# Patient Record
Sex: Female | Born: 1960 | Race: White | Hispanic: No | Marital: Single | Smoking: Current every day smoker
Health system: Southern US, Community
[De-identification: ages and names within clinical notes are randomized; demographics above are authoritative.]

## PROBLEM LIST (undated history)

## (undated) DIAGNOSIS — M1990 Unspecified osteoarthritis, unspecified site: Secondary | ICD-10-CM

## (undated) DIAGNOSIS — I10 Essential (primary) hypertension: Secondary | ICD-10-CM

## (undated) HISTORY — PX: SHOULDER CAPSULORRHAPHY: SUR188

## (undated) HISTORY — PX: WRIST FLEXION TENDON TENOTOMIES AND PROXIMAL CORPECTOMY W/ WRIST ARTHRODESIS&ILIAC CREST BONE GRAFT: SHX2672

---

## 2003-10-24 ENCOUNTER — Ambulatory Visit (HOSPITAL_BASED_OUTPATIENT_CLINIC_OR_DEPARTMENT_OTHER): Admission: RE | Admit: 2003-10-24 | Discharge: 2003-10-24 | Payer: Self-pay | Admitting: Orthopedic Surgery

## 2004-09-17 ENCOUNTER — Ambulatory Visit (HOSPITAL_BASED_OUTPATIENT_CLINIC_OR_DEPARTMENT_OTHER): Admission: RE | Admit: 2004-09-17 | Discharge: 2004-09-17 | Payer: Self-pay | Admitting: Orthopedic Surgery

## 2004-09-17 ENCOUNTER — Ambulatory Visit (HOSPITAL_COMMUNITY): Admission: RE | Admit: 2004-09-17 | Discharge: 2004-09-17 | Payer: Self-pay | Admitting: Orthopedic Surgery

## 2004-12-30 ENCOUNTER — Ambulatory Visit (HOSPITAL_BASED_OUTPATIENT_CLINIC_OR_DEPARTMENT_OTHER): Admission: RE | Admit: 2004-12-30 | Discharge: 2004-12-30 | Payer: Self-pay | Admitting: Orthopedic Surgery

## 2006-10-19 ENCOUNTER — Ambulatory Visit (HOSPITAL_COMMUNITY): Admission: RE | Admit: 2006-10-19 | Discharge: 2006-10-19 | Payer: Self-pay | Admitting: Oncology

## 2006-11-21 ENCOUNTER — Other Ambulatory Visit: Admission: RE | Admit: 2006-11-21 | Discharge: 2006-11-21 | Payer: Self-pay | Admitting: Family Medicine

## 2009-11-26 ENCOUNTER — Inpatient Hospital Stay (HOSPITAL_COMMUNITY): Admission: AD | Admit: 2009-11-26 | Discharge: 2009-12-03 | Payer: Self-pay | Admitting: Internal Medicine

## 2009-11-26 ENCOUNTER — Encounter: Payer: Self-pay | Admitting: Emergency Medicine

## 2009-11-26 ENCOUNTER — Ambulatory Visit: Payer: Self-pay | Admitting: Internal Medicine

## 2009-11-26 ENCOUNTER — Ambulatory Visit: Payer: Self-pay | Admitting: Diagnostic Radiology

## 2009-12-01 ENCOUNTER — Encounter (INDEPENDENT_AMBULATORY_CARE_PROVIDER_SITE_OTHER): Payer: Self-pay | Admitting: Internal Medicine

## 2009-12-02 ENCOUNTER — Ambulatory Visit: Payer: Self-pay | Admitting: Internal Medicine

## 2009-12-14 ENCOUNTER — Emergency Department (HOSPITAL_BASED_OUTPATIENT_CLINIC_OR_DEPARTMENT_OTHER): Admission: EM | Admit: 2009-12-14 | Discharge: 2009-12-14 | Payer: Self-pay | Admitting: Emergency Medicine

## 2009-12-16 ENCOUNTER — Emergency Department (HOSPITAL_BASED_OUTPATIENT_CLINIC_OR_DEPARTMENT_OTHER): Admission: EM | Admit: 2009-12-16 | Discharge: 2009-12-16 | Payer: Self-pay | Admitting: Emergency Medicine

## 2009-12-24 ENCOUNTER — Ambulatory Visit: Payer: Self-pay | Admitting: Internal Medicine

## 2009-12-30 ENCOUNTER — Emergency Department (HOSPITAL_COMMUNITY): Admission: EM | Admit: 2009-12-30 | Discharge: 2009-12-31 | Payer: Self-pay | Admitting: Emergency Medicine

## 2010-01-15 ENCOUNTER — Telehealth: Payer: Self-pay | Admitting: Internal Medicine

## 2010-07-12 ENCOUNTER — Encounter: Payer: Self-pay | Admitting: Family Medicine

## 2010-07-21 NOTE — Procedures (Signed)
Summary: Summary Report  Summary Report   Imported By: Erle Crocker 01/23/2010 11:24:36  _____________________________________________________________________  External Attachment:    Type:   Image     Comment:   External Document

## 2010-07-21 NOTE — Progress Notes (Signed)
Summary: Event Monitor Results  Phone Note Outgoing Call   Call placed by: Bernita Raisin, RN, BSN,  January 15, 2010 12:52 PM Call placed to: Patient Reason for Call: Discuss lab or test results Details for Reason: Event Monitor Results Summary of Call: RN s/w Pt made aware of test results. Bernita Raisin, RN, BSN  January 15, 2010 12:56 PM

## 2010-09-06 LAB — DIFFERENTIAL
Basophils Absolute: 0 10*3/uL (ref 0.0–0.1)
Basophils Absolute: 0 10*3/uL (ref 0.0–0.1)
Basophils Absolute: 0.3 10*3/uL — ABNORMAL HIGH (ref 0.0–0.1)
Basophils Relative: 1 % (ref 0–1)
Basophils Relative: 2 % — ABNORMAL HIGH (ref 0–1)
Eosinophils Absolute: 0 10*3/uL (ref 0.0–0.7)
Eosinophils Absolute: 0 10*3/uL (ref 0.0–0.7)
Eosinophils Relative: 0 % (ref 0–5)
Eosinophils Relative: 1 % (ref 0–5)
Lymphocytes Relative: 12 % (ref 12–46)
Lymphocytes Relative: 16 % (ref 12–46)
Lymphs Abs: 0.7 10*3/uL (ref 0.7–4.0)
Lymphs Abs: 0.8 K/uL (ref 0.7–4.0)
Monocytes Absolute: 0.3 K/uL (ref 0.1–1.0)
Monocytes Relative: 5 % (ref 3–12)
Monocytes Relative: 6 % (ref 3–12)
Neutro Abs: 4.4 10*3/uL (ref 1.7–7.7)
Neutro Abs: 3.7 K/uL (ref 1.7–7.7)
Neutro Abs: 8.4 10*3/uL — ABNORMAL HIGH (ref 1.7–7.7)
Neutrophils Relative %: 77 % (ref 43–77)
Neutrophils Relative %: 80 % — ABNORMAL HIGH (ref 43–77)

## 2010-09-06 LAB — FERRITIN: Ferritin: 401 ng/mL — ABNORMAL HIGH (ref 10–291)

## 2010-09-06 LAB — IRON AND TIBC
Iron: 37 ug/dL — ABNORMAL LOW (ref 42–135)
TIBC: 156 ug/dL — ABNORMAL LOW (ref 250–470)

## 2010-09-06 LAB — BASIC METABOLIC PANEL
BUN: 6 mg/dL (ref 6–23)
Calcium: 10.1 mg/dL (ref 8.4–10.5)
Creatinine, Ser: 1.03 mg/dL (ref 0.4–1.2)
GFR calc non Af Amer: 57 mL/min — ABNORMAL LOW (ref >60)
Glucose, Bld: 142 mg/dL — ABNORMAL HIGH (ref 70–99)
Sodium: 133 mEq/L — ABNORMAL LOW (ref 135–145)

## 2010-09-06 LAB — URINE MICROSCOPIC-ADD ON

## 2010-09-06 LAB — URINE CULTURE
Colony Count: NO GROWTH
Culture: NO GROWTH

## 2010-09-06 LAB — CBC
HCT: 30.4 % — ABNORMAL LOW (ref 36.0–46.0)
HCT: 34.6 % — ABNORMAL LOW (ref 36.0–46.0)
Hemoglobin: 10.4 g/dL — ABNORMAL LOW (ref 12.0–15.0)
Hemoglobin: 11.7 g/dL — ABNORMAL LOW (ref 12.0–15.0)
Hemoglobin: 7.8 g/dL — ABNORMAL LOW (ref 12.0–15.0)
MCH: 36.4 pg — ABNORMAL HIGH (ref 26.0–34.0)
MCHC: 34.3 g/dL (ref 30.0–36.0)
MCV: 104.9 fL — ABNORMAL HIGH (ref 78.0–100.0)
MCV: 104.8 fL — ABNORMAL HIGH (ref 78.0–100.0)
MCV: 106 fL — ABNORMAL HIGH (ref 78.0–100.0)
MCV: 108.6 fL — ABNORMAL HIGH (ref 78.0–100.0)
Platelets: 351 10*3/uL (ref 150–400)
Platelets: 179 K/uL (ref 150–400)
Platelets: 258 10*3/uL (ref 150–400)
RBC: 3.37 MIL/uL — ABNORMAL LOW (ref 3.87–5.11)
RBC: 2.86 MIL/uL — ABNORMAL LOW (ref 3.87–5.11)
RBC: 3.3 MIL/uL — ABNORMAL LOW (ref 3.87–5.11)
RDW: 17.3 % — ABNORMAL HIGH (ref 11.5–15.5)
RDW: 17.6 % — ABNORMAL HIGH (ref 11.5–15.5)
WBC: 5.5 10*3/uL (ref 4.0–10.5)
WBC: 10.5 10*3/uL (ref 4.0–10.5)
WBC: 4.8 K/uL (ref 4.0–10.5)

## 2010-09-06 LAB — RAPID URINE DRUG SCREEN, HOSP PERFORMED
Benzodiazepines: NOT DETECTED
Cocaine: NOT DETECTED

## 2010-09-06 LAB — URINALYSIS, ROUTINE W REFLEX MICROSCOPIC
Bilirubin Urine: NEGATIVE
Bilirubin Urine: NEGATIVE
Glucose, UA: NEGATIVE mg/dL
Glucose, UA: NEGATIVE mg/dL
Hgb urine dipstick: NEGATIVE
Ketones, ur: NEGATIVE mg/dL
Ketones, ur: NEGATIVE mg/dL
Leukocytes, UA: NEGATIVE
Nitrite: NEGATIVE
Nitrite: NEGATIVE
Protein, ur: 100 mg/dL — AB
Protein, ur: NEGATIVE mg/dL
Specific Gravity, Urine: 1.013 (ref 1.005–1.030)
Specific Gravity, Urine: 1.008 (ref 1.005–1.030)
Urobilinogen, UA: 0.2 mg/dL (ref 0.0–1.0)
pH: 5.5 (ref 5.0–8.0)
pH: 5.5 (ref 5.0–8.0)
pH: 5.5 (ref 5.0–8.0)

## 2010-09-06 LAB — COMPREHENSIVE METABOLIC PANEL
ALT: 116 U/L — ABNORMAL HIGH (ref 0–35)
ALT: 70 U/L — ABNORMAL HIGH (ref 0–35)
AST: 97 U/L — ABNORMAL HIGH (ref 0–37)
Alkaline Phosphatase: 160 U/L — ABNORMAL HIGH (ref 39–117)
Alkaline Phosphatase: 237 U/L — ABNORMAL HIGH (ref 39–117)
Alkaline Phosphatase: 298 U/L — ABNORMAL HIGH (ref 39–117)
BUN: 3 mg/dL — ABNORMAL LOW (ref 6–23)
BUN: 6 mg/dL (ref 6–23)
CO2: 18 mEq/L — ABNORMAL LOW (ref 19–32)
CO2: 25 mEq/L (ref 19–32)
Chloride: 102 mEq/L (ref 96–112)
Chloride: 99 mEq/L (ref 96–112)
Creatinine, Ser: 0.52 mg/dL (ref 0.4–1.2)
GFR calc Af Amer: >60 mL/min (ref >60)
GFR calc non Af Amer: 48 mL/min — ABNORMAL LOW (ref >60)
GFR calc non Af Amer: >60 mL/min (ref >60)
Glucose, Bld: 101 mg/dL — ABNORMAL HIGH (ref 70–99)
Glucose, Bld: 103 mg/dL — ABNORMAL HIGH (ref 70–99)
Glucose, Bld: 89 mg/dL (ref 70–99)
Potassium: 3.6 mEq/L (ref 3.5–5.1)
Potassium: 4.5 mEq/L (ref 3.5–5.1)
Sodium: 136 mEq/L (ref 135–145)
Sodium: 137 mEq/L (ref 135–145)
Total Bilirubin: 0.6 mg/dL (ref 0.3–1.2)
Total Bilirubin: 1.6 mg/dL — ABNORMAL HIGH (ref 0.3–1.2)
Total Bilirubin: 2 mg/dL — ABNORMAL HIGH (ref 0.3–1.2)
Total Protein: 4.8 g/dL — ABNORMAL LOW (ref 6.0–8.3)

## 2010-09-06 LAB — COMPREHENSIVE METABOLIC PANEL WITH GFR
Albumin: 3.3 g/dL — ABNORMAL LOW (ref 3.5–5.2)
BUN: 5 mg/dL — ABNORMAL LOW (ref 6–23)
Calcium: 8.8 mg/dL (ref 8.4–10.5)
Potassium: 3.1 meq/L — ABNORMAL LOW (ref 3.5–5.1)
Total Protein: 6.5 g/dL (ref 6.0–8.3)

## 2010-09-06 LAB — HEMOCCULT GUIAC POC 1CARD (OFFICE)
Fecal Occult Bld: NEGATIVE
Fecal Occult Bld: POSITIVE

## 2010-09-06 LAB — LIPASE, BLOOD: Lipase: 264 U/L (ref 23–300)

## 2010-09-07 LAB — CARDIAC PANEL(CRET KIN+CKTOT+MB+TROPI)
CK, MB: 1.4 ng/mL (ref 0.3–4.0)
Relative Index: INVALID (ref 0.0–2.5)
Relative Index: INVALID (ref 0.0–2.5)
Total CK: 77 U/L (ref 7–177)
Troponin I: 0.09 ng/mL — ABNORMAL HIGH (ref 0.00–0.06)
Troponin I: 0.1 ng/mL — ABNORMAL HIGH (ref 0.00–0.06)

## 2010-09-07 LAB — COMPREHENSIVE METABOLIC PANEL
ALT: 29 U/L (ref 0–35)
ALT: 39 U/L — ABNORMAL HIGH (ref 0–35)
AST: 147 U/L — ABNORMAL HIGH (ref 0–37)
AST: 52 U/L — ABNORMAL HIGH (ref 0–37)
AST: 54 U/L — ABNORMAL HIGH (ref 0–37)
Albumin: 2.3 g/dL — ABNORMAL LOW (ref 3.5–5.2)
Albumin: 2.7 g/dL — ABNORMAL LOW (ref 3.5–5.2)
Albumin: 2.8 g/dL — ABNORMAL LOW (ref 3.5–5.2)
Albumin: 4.4 g/dL (ref 3.5–5.2)
Alkaline Phosphatase: 108 U/L (ref 39–117)
Alkaline Phosphatase: 137 U/L — ABNORMAL HIGH (ref 39–117)
Alkaline Phosphatase: 184 U/L — ABNORMAL HIGH (ref 39–117)
Alkaline Phosphatase: 189 U/L — ABNORMAL HIGH (ref 39–117)
BUN: 12 mg/dL (ref 6–23)
CO2: 14 mEq/L — ABNORMAL LOW (ref 19–32)
CO2: 21 mEq/L (ref 19–32)
CO2: 26 mEq/L (ref 19–32)
Chloride: 107 mEq/L (ref 96–112)
Chloride: 111 mEq/L (ref 96–112)
Chloride: 98 mEq/L (ref 96–112)
Creatinine, Ser: 0.49 mg/dL (ref 0.4–1.2)
Creatinine, Ser: 1.6 mg/dL — ABNORMAL HIGH (ref 0.4–1.2)
GFR calc Af Amer: >60 mL/min (ref >60)
GFR calc Af Amer: >60 mL/min (ref >60)
GFR calc Af Amer: >60 mL/min (ref >60)
GFR calc non Af Amer: 34 mL/min — ABNORMAL LOW (ref >60)
GFR calc non Af Amer: >60 mL/min (ref >60)
GFR calc non Af Amer: >60 mL/min (ref >60)
Glucose, Bld: 102 mg/dL — ABNORMAL HIGH (ref 70–99)
Glucose, Bld: 125 mg/dL — ABNORMAL HIGH (ref 70–99)
Glucose, Bld: 183 mg/dL — ABNORMAL HIGH (ref 70–99)
Glucose, Bld: 91 mg/dL (ref 70–99)
Potassium: 2.6 mEq/L — CL (ref 3.5–5.1)
Potassium: 3.2 mEq/L — ABNORMAL LOW (ref 3.5–5.1)
Potassium: 3.4 mEq/L — ABNORMAL LOW (ref 3.5–5.1)
Potassium: 3.5 mEq/L (ref 3.5–5.1)
Sodium: 137 mEq/L (ref 135–145)
Sodium: 140 mEq/L (ref 135–145)
Total Bilirubin: 1.4 mg/dL — ABNORMAL HIGH (ref 0.3–1.2)
Total Bilirubin: 1.5 mg/dL — ABNORMAL HIGH (ref 0.3–1.2)
Total Bilirubin: 2.9 mg/dL — ABNORMAL HIGH (ref 0.3–1.2)
Total Protein: 4.3 g/dL — ABNORMAL LOW (ref 6.0–8.3)
Total Protein: 5.1 g/dL — ABNORMAL LOW (ref 6.0–8.3)

## 2010-09-07 LAB — URINALYSIS, ROUTINE W REFLEX MICROSCOPIC
Glucose, UA: NEGATIVE mg/dL
Hgb urine dipstick: NEGATIVE
Ketones, ur: 40 mg/dL — AB
Leukocytes, UA: NEGATIVE
Protein, ur: 100 mg/dL — AB
Urobilinogen, UA: 1 mg/dL (ref 0.0–1.0)

## 2010-09-07 LAB — IRON AND TIBC: Iron: 131 ug/dL (ref 42–135)

## 2010-09-07 LAB — BASIC METABOLIC PANEL
BUN: 3 mg/dL — ABNORMAL LOW (ref 6–23)
CO2: 19 mEq/L (ref 19–32)
Calcium: 8.9 mg/dL (ref 8.4–10.5)
Creatinine, Ser: 0.58 mg/dL (ref 0.4–1.2)
Glucose, Bld: 94 mg/dL (ref 70–99)

## 2010-09-07 LAB — URINE MICROSCOPIC-ADD ON

## 2010-09-07 LAB — CBC
HCT: 23.4 % — ABNORMAL LOW (ref 36.0–46.0)
HCT: 33.8 % — ABNORMAL LOW (ref 36.0–46.0)
Hemoglobin: 11.5 g/dL — ABNORMAL LOW (ref 12.0–15.0)
Hemoglobin: 7.4 g/dL — ABNORMAL LOW (ref 12.0–15.0)
Hemoglobin: 9.2 g/dL — ABNORMAL LOW (ref 12.0–15.0)
MCV: 106.2 fL — ABNORMAL HIGH (ref 78.0–100.0)
Platelets: 109 10*3/uL — ABNORMAL LOW (ref 150–400)
Platelets: 178 10*3/uL (ref 150–400)
Platelets: 187 10*3/uL (ref 150–400)
RBC: 2.02 MIL/uL — ABNORMAL LOW (ref 3.87–5.11)
RBC: 2.22 MIL/uL — ABNORMAL LOW (ref 3.87–5.11)
RBC: 3.18 MIL/uL — ABNORMAL LOW (ref 3.87–5.11)
RDW: 17 % — ABNORMAL HIGH (ref 11.5–15.5)
RDW: 17.4 % — ABNORMAL HIGH (ref 11.5–15.5)
WBC: 3.1 10*3/uL — ABNORMAL LOW (ref 4.0–10.5)
WBC: 3.6 10*3/uL — ABNORMAL LOW (ref 4.0–10.5)
WBC: 6.5 10*3/uL (ref 4.0–10.5)

## 2010-09-07 LAB — SODIUM, URINE, RANDOM: Sodium, Ur: 28 mEq/L

## 2010-09-07 LAB — HEMOGLOBIN A1C
Hgb A1c MFr Bld: 4.7 % (ref <5.7)
Mean Plasma Glucose: 82 mg/dL (ref <117)

## 2010-09-07 LAB — LIPASE, BLOOD
Lipase: 53 U/L (ref 11–59)
Lipase: 932 U/L — ABNORMAL HIGH (ref 23–300)

## 2010-09-07 LAB — DIFFERENTIAL
Basophils Absolute: 0 10*3/uL (ref 0.0–0.1)
Basophils Absolute: 0 10*3/uL (ref 0.0–0.1)
Basophils Relative: 0 % (ref 0–1)
Basophils Relative: 1 % (ref 0–1)
Eosinophils Absolute: 0 10*3/uL (ref 0.0–0.7)
Eosinophils Relative: 1 % (ref 0–5)
Lymphocytes Relative: 8 % — ABNORMAL LOW (ref 12–46)
Monocytes Absolute: 0.3 10*3/uL (ref 0.1–1.0)
Monocytes Absolute: 0.7 10*3/uL (ref 0.1–1.0)
Neutro Abs: 5.3 10*3/uL (ref 1.7–7.7)

## 2010-09-07 LAB — HEPARIN LEVEL (UNFRACTIONATED): Heparin Unfractionated: 0.58 IU/mL (ref 0.30–0.70)

## 2010-09-07 LAB — MAGNESIUM: Magnesium: 1.3 mg/dL — ABNORMAL LOW (ref 1.5–2.5)

## 2010-09-07 LAB — HEPATITIS PANEL, ACUTE
HCV Ab: NEGATIVE
Hep A IgM: NEGATIVE
Hepatitis B Surface Ag: NEGATIVE

## 2010-09-07 LAB — CLOSTRIDIUM DIFFICILE EIA: C difficile Toxins A+B, EIA: NEGATIVE

## 2010-09-07 LAB — LIPID PANEL
HDL: 34 mg/dL — ABNORMAL LOW (ref >39)
Triglycerides: 157 mg/dL — ABNORMAL HIGH (ref <150)
VLDL: 31 mg/dL (ref 0–40)

## 2010-09-07 LAB — CREATININE, URINE, RANDOM: Creatinine, Urine: 96.7 mg/dL

## 2010-09-07 LAB — LACTIC ACID, PLASMA: Lactic Acid, Venous: 1.1 mmol/L (ref 0.5–2.2)

## 2010-09-07 LAB — RETICULOCYTES
RBC.: 2.54 MIL/uL — ABNORMAL LOW (ref 3.87–5.11)
Retic Ct Pct: 0.4 % (ref 0.4–3.1)

## 2010-09-07 LAB — FERRITIN: Ferritin: 1526 ng/mL — ABNORMAL HIGH (ref 10–291)

## 2010-11-06 NOTE — Op Note (Signed)
Brianna Calderon, Brianna Calderon                 ACCOUNT NO.:  [id number]   MEDICAL RECORD NO.:  [id number]          PATIENT TYPE:  AMB   LOCATION:  DSC                          FACILITY:  [facility]   PHYSICIAN:  Loreta Ave, M.D. DATE OF BIRTH:  11/17/1960   DATE OF PROCEDURE:  DATE OF DISCHARGE:                                 OPERATIVE REPORT   PREOPERATIVE DIAGNOSIS:  Symptomatic ganglion cyst, dorsal aspect right  thumb, interphalangeal joint.   POSTOPERATIVE DIAGNOSES:  Symptomatic ganglion cyst, dorsal aspect right  thumb, interphalangeal joint with ganglion emanating through a longitudinal  split in the extensor tendon at the interphalangeal joint.   PROCEDURE:  Complete excision of ganglion dorsal aspect right thumb.   SURGEON:  Loreta Ave, M.D.   ASSISTANT:  Garnette Scheuermann, P.A.   ANESTHESIA:  IV regional.   SPECIMENS:  None.   CULTURES:  None.   COMPLICATIONS:  None.   DRESSINGS:  Sterile compressive with thumb spica splint.   DESCRIPTION OF PROCEDURE:  The patient was brought to the operating room and  placed on the operating table in the supine position.  After adequate  anesthesia had been obtained, prepped and draped in the usual sterile  fashion.  The cyst about 1 cm in diameter, approached through a transverse  incision, dorsal aspect of the IP joint.  Isolated as it is already  invaginated through the extensor tendon.  Excised in its entirety and  tracked right to the middle of the extensor tendon where this had  invaginated through the tendon.  There was a longitudinal splint about 1 cm  long in the tendon from the ganglion coming through this.  This was  retracted, joint exposed, hypertrophic synovitis and the reddening of the  ganglion removed.  All recesses examined to be sure that there were no other  findings.  A little weakening and attrition on the top of the tendon but  most of this was structurally intact.  A longitudinal split was very distal  and left  open as I felt placing a suture there was going to cause more  irritation than  not and the longitudinal integrity of the tendon was still there.  Wound  irrigated.  Skin closed with nylon.  Margins of the wound were injected with  Marcaine without epinephrine.  Sterile compressive dressing and thumb spica  splint applied.  Anesthesia reversed.  Brought to the recovery room.  Tolerated the surgery well, no complications.       DFM/MEDQ  D:  12/30/2004  T:  12/30/2004  Job:  [id number]

## 2010-11-06 NOTE — Op Note (Signed)
Brianna Calderon, BAEZ                 ACCOUNT NO.:  [id number]   MEDICAL RECORD NO.:  [id number]          PATIENT TYPE:  AMB   LOCATION:  DSC                          FACILITY:  [facility]   PHYSICIAN:  Loreta Ave, M.D. 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   POSTOPERATIVE DIAGNOSIS:  Chronic impingement left shoulder with os  acromiale and marked osteolysis degenerative change and spurs AC joint.  Roughening partial tear of superior rotator cuff as well as anterior labrum  tear.   PROCEDURE:  Left shoulder examination under anesthesia, arthroscopy,  debridement of labrum and rotator cuff, acromioplasty, CA ligament release,  and excision distal clavicle.   SURGEON:  Loreta Ave, M.D.   ASSISTANT:  Genene Churn. Denton Meek.   ANESTHESIA:  General.   ESTIMATED BLOOD LOSS:  Minimal.   SPECIMENS:  None.   CULTURES:  None.   COMPLICATIONS:  None.   DRESSING:  Soft compressive with sling.   DESCRIPTION OF PROCEDURE:  The patient was brought to the operating room and  after adequate anesthesia had been obtained, the left shoulder was examined.  Full motion and good stability.  Placed in a beach chair position in a  shoulder positioner.  Prepped and draped in the usual sterile fashion.  Three portals created; anterior, posterior, and lateral.  Shoulder entered  with blunt obturator, distended, inspected with the arthroscope.  Roughening  anterior labrum debrided with a shaver.  Undersurface of the cuff, capsular  and ligamentous structures, biceps tendon, biceps anchor, labrum intact.  Otherwise except for a little roughening at the biceps attachment which was  debrided.  No slap lesion, but a little hypermobility. Cuff looked  excellent.  Cannula  redirected subacromially.  Marked impingement and  bursitis.  Cuff debrided.  Bursa resected.  Type 3 acromion with a 2 to 3 mm  mobile os acromiale.  The os acromiale resected.  CA ligament release.  Acromioplasty to a type 1 acromion with shaver and high speed bur.  Distal  clavicle with grade IV changes.  Osteolysis and spurs.  Spurs removed.  Lateral cm of clavicle resected.  Adequacy of decompression and clavicle  excision and decompression confirmed viewing from all portals.  Cuff had a  lot of roughening on the top of the supraspinatus and on the top of the  infraspinatus, both debridement.  No full  thickness structural tears.  Instruments and fluid removed.  Portals,  shoulder, and bursa injected with Marcaine.  Portals closed with 4-0 nylon.  A sterile compressive dressing applied.  Anesthesia reversed.  Brought to  the recovery room.  Tolerated the surgery well with no complications.      DFM/MEDQ  D:  09/17/2004  T:  09/17/2004  Job:  [id number]

## 2010-11-06 NOTE — Op Note (Signed)
NAME:  Brianna Calderon, Brianna Calderon                           ACCOUNT NO.:  [id number]   MEDICAL RECORD NO.:  [id number]                   PATIENT TYPE:  AMB   LOCATION:  DSC                                  FACILITY:  [facility]   PHYSICIAN:  Loreta Ave, M.D.              DATE OF BIRTH:  04-06-1961   DATE OF PROCEDURE:  10/24/2003  DATE OF DISCHARGE:                                 OPERATIVE REPORT   PREOPERATIVE DIAGNOSIS:  Symptomatic ganglion volar aspect, right wrist.   POSTOPERATIVE DIAGNOSIS:  Symptomatic ganglion volar aspect, right wrist.   PROCEDURE:  Excision ganglion, volar aspect right wrist.   SURGEON:  Loreta Ave, M.D.   ASSISTANT:  Arlys John D. Petrarca, P.A.-C.   ANESTHESIA:  General.   ESTIMATED BLOOD LOSS:  Minimal.   SPECIMENS:  None.   CULTURES:  None.   COMPLICATIONS:  None.   DRESSINGS:  Soft compressive.   TOURNIQUET TIME:  30 minutes.   DESCRIPTION OF PROCEDURE:  The patient was brought to the operating room and  after adequate anesthesia had been obtained prepped and draped in the usual  sterile fashion.  Exsanguinated with elevation of the Esmarch.  Tourniquet  inflated to 250 mmHg.  Ganglion in the volar aspect of the wrist near the  radial artery bifurcation was approached with a longitudinal incision  slightly elliptical around the ganglion.  The skin and subcutaneous tissue  divided.  Neurovascular structures identified and protected.  Ganglion which  was more than 1 cm in diameter and a little bit multilobular was excised in  its entirety tracing all the way down to the volar wrist capsule.  The neck  and the capsule was removed.  The entire ganglion removed.  Other structures  protected.  All recesses explored to be sure there were no other satellite  lesions or new ganglion.  Wound irrigated.  Skin closed with nylon.  Margins  of the wound injected with Marcaine.  Sterile compressive dressing with a  bulky dressing and splint applied.  Tourniquet  inflated and removed.  Anesthesia reversed.  Brought to the recovery room.  Tolerated the surgery  well with no complications.                                               Loreta Ave, M.D.   DFM/MEDQ  D:  10/24/2003  T:  10/24/2003  Job:  [id number]

## 2011-10-13 IMAGING — US US ABDOMEN COMPLETE
1 series · 14 of 25 positions shown · non-contrast
Comparison: CT on 11/26/2009

CLINICAL DATA: Pancreatitis.  Nausea and vomiting.  Elevated liver
function tests.

ABDOMINAL ULTRASOUND COMPLETE

[Series 1: us abdomen complete · 0.28mm/px · 14 of 48 slices shown]
[im 1/48]
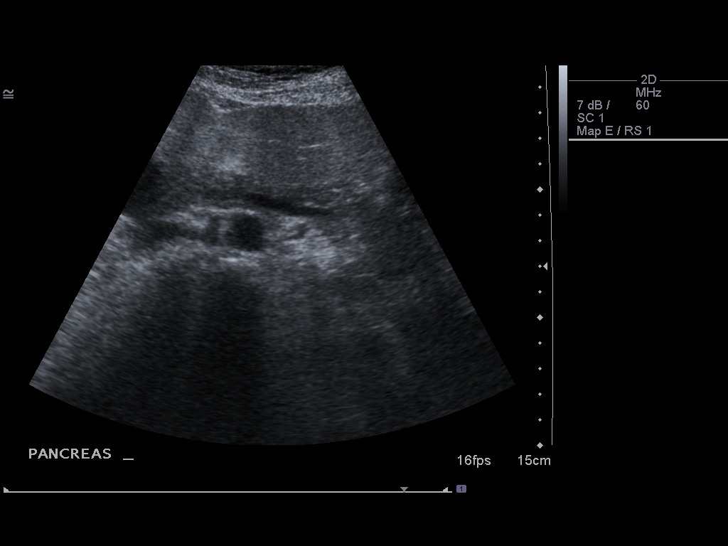
[im 4/48]
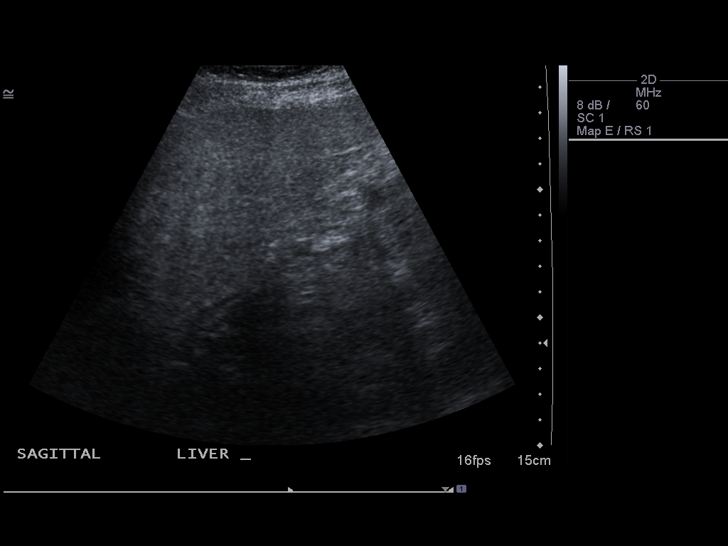
[im 8/48]
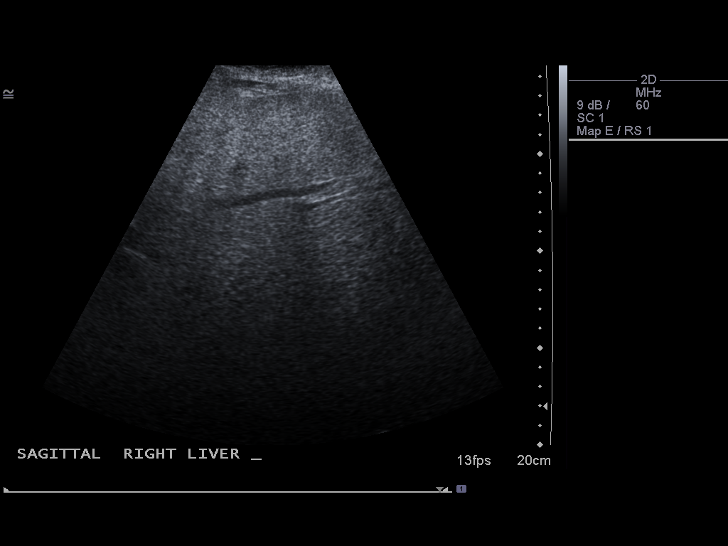
[im 12/48]
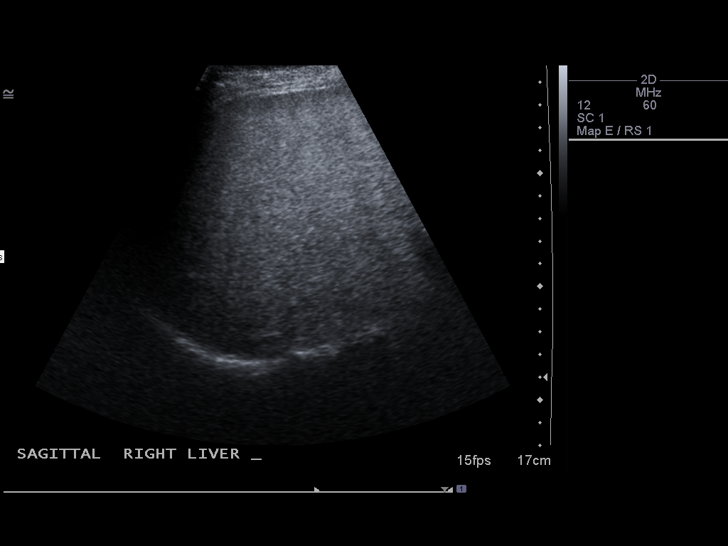
[im 16/48]
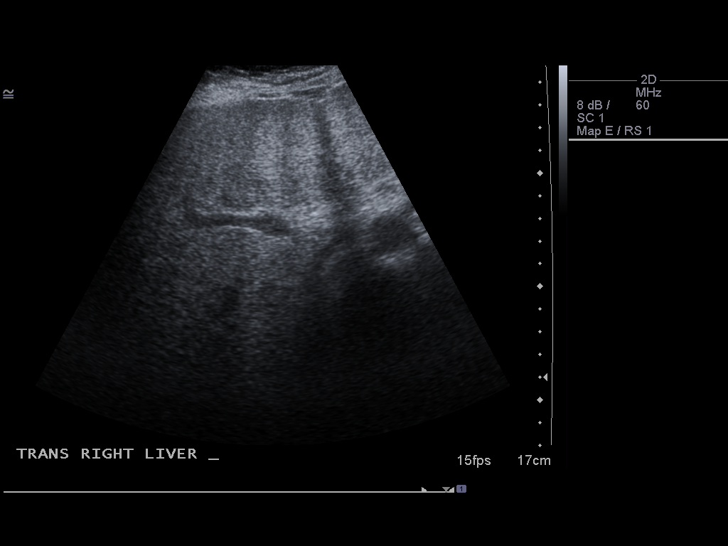
[im 18/48]
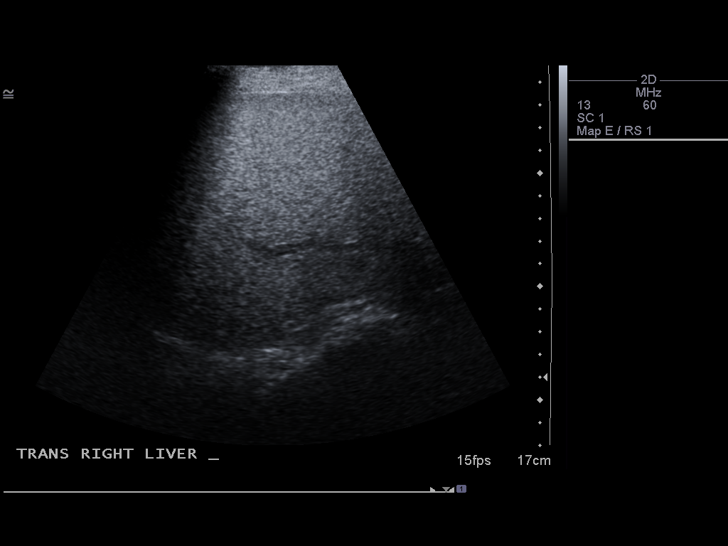
[im 22/48]
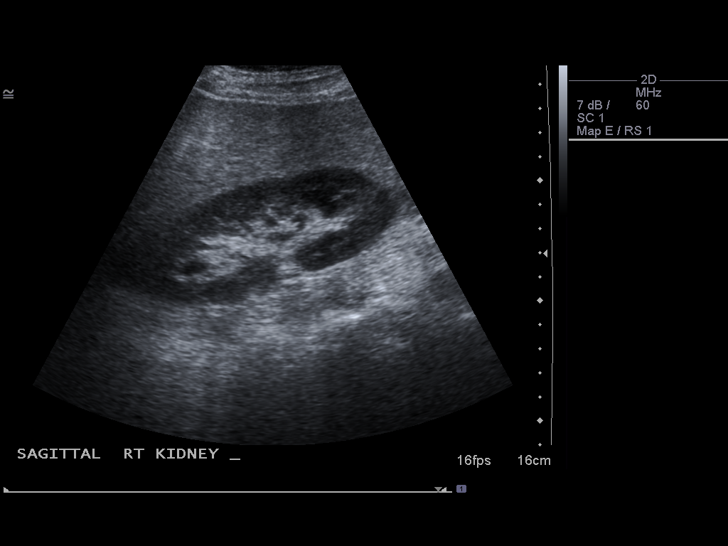
[im 26/48]
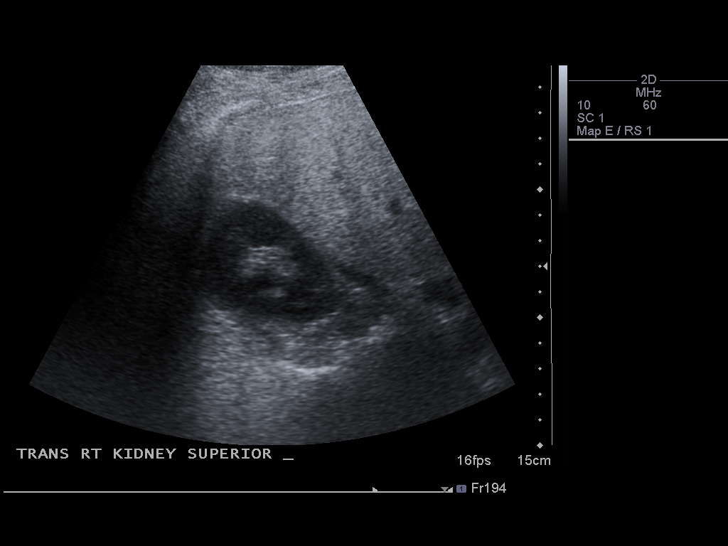
[im 30/48]
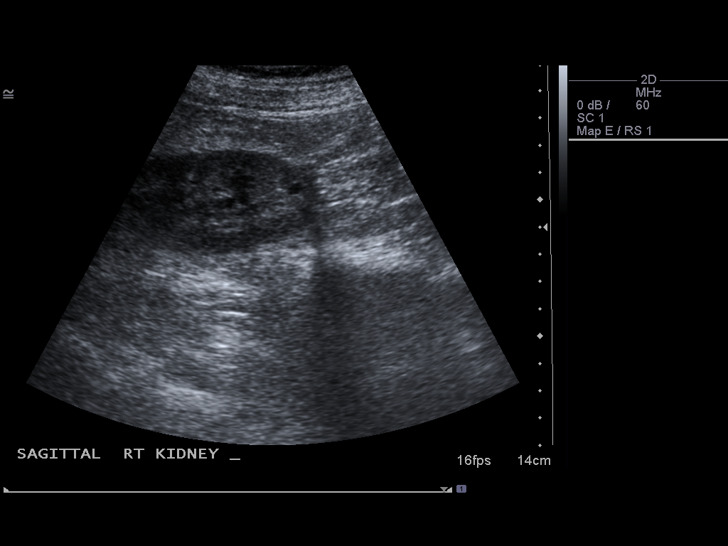
[im 32/48]
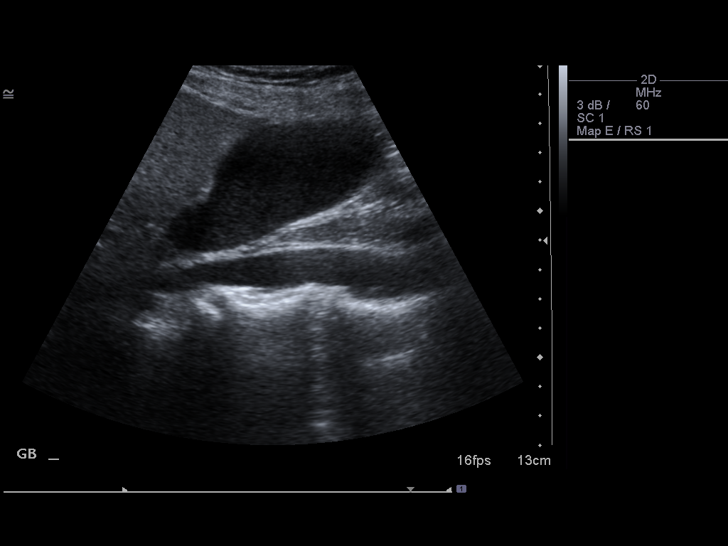
[im 36/48]
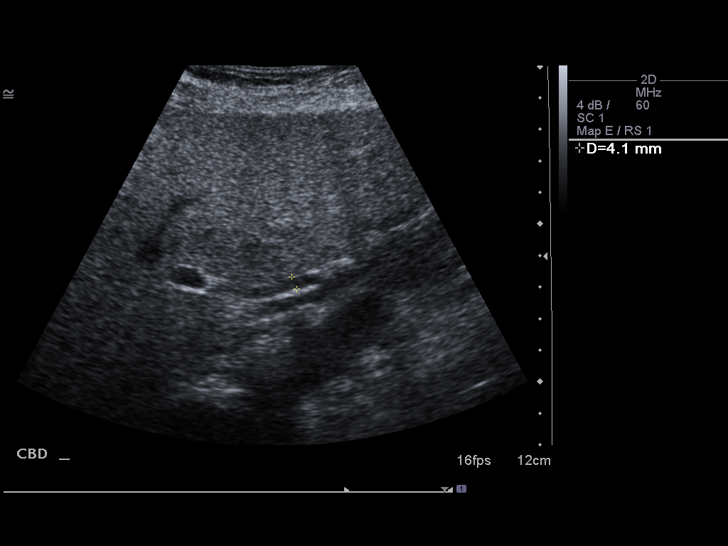
[im 40/48]
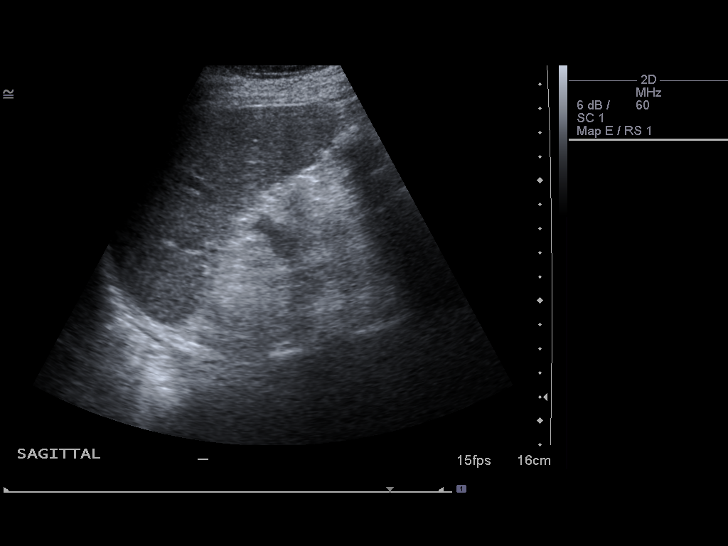
[im 44/48]
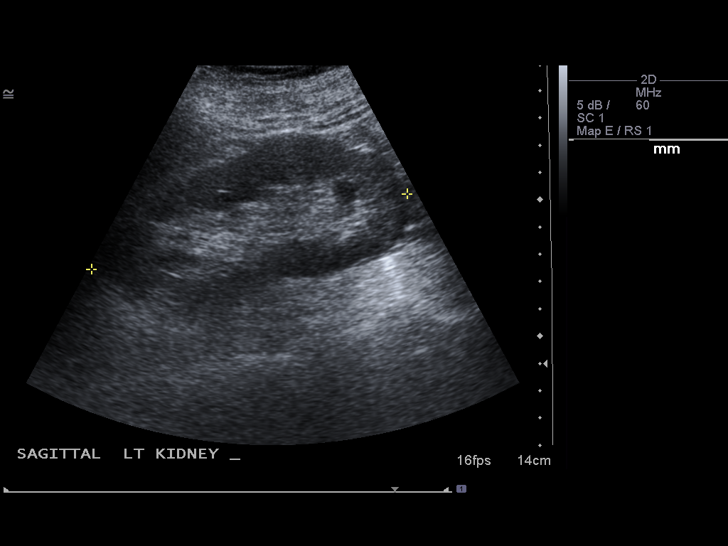
[im 48/48]
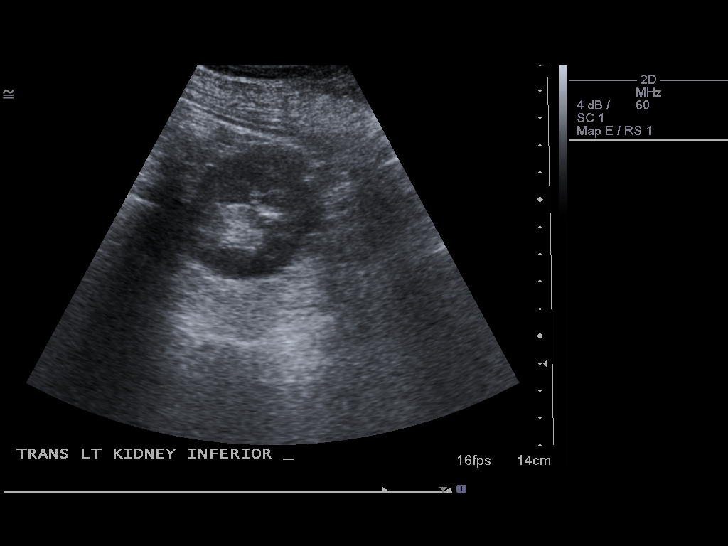

[14 of 25 positions shown; findings below may reference images not displayed]

FINDINGS: Gallbladder:  Gallbladder sludge is noted, without evidence of
discrete gallstones.  No evidence of gallbladder wall thickening or
pericholecystic fluid.

Common Bile Duct:  Within normal limits in caliber. Measures 4 mm
in diameter.

Liver:  No focal mass lesion identified.  Diffusely increased and
coarsened echotexture, consistent with hepatic steatosis.

IVC:  Appears normal.

Pancreas:  No abnormality identified, although entire pancreas
cannot be visualized by ultrasound.

Spleen:  Within normal limits in size and echotexture.

Right kidney:  Normal in size and parenchymal echogenicity.  No
evidence of mass or hydronephrosis. Tiny less than 1 cm cyst noted
in the lower pole.

Left kidney:  Normal in size and parenchymal echogenicity.  No
evidence of mass or hydronephrosis.

Abdominal Aorta:  No aneurysm identified.
IMPRESSION: 1.  Gallbladder sludge, without evidence of discrete gallstones or
gallbladder wall thickening.
2.  No evidence of biliary dilatation.
3.  Hepatic steatosis.

## 2017-06-27 ENCOUNTER — Other Ambulatory Visit: Payer: Self-pay | Admitting: Orthopedic Surgery

## 2017-07-07 ENCOUNTER — Encounter (HOSPITAL_BASED_OUTPATIENT_CLINIC_OR_DEPARTMENT_OTHER): Payer: Self-pay | Admitting: *Deleted

## 2017-07-13 ENCOUNTER — Encounter (HOSPITAL_BASED_OUTPATIENT_CLINIC_OR_DEPARTMENT_OTHER)
Admission: RE | Admit: 2017-07-13 | Discharge: 2017-07-13 | Disposition: A | Discharge status: Home / Self Care | Payer: 59 | Source: Ambulatory Visit | Attending: Orthopedic Surgery | Admitting: Orthopedic Surgery

## 2017-07-13 DIAGNOSIS — Z87891 Personal history of nicotine dependence: Secondary | ICD-10-CM | POA: Diagnosis not present

## 2017-07-13 DIAGNOSIS — Z7982 Long term (current) use of aspirin: Secondary | ICD-10-CM | POA: Diagnosis not present

## 2017-07-13 DIAGNOSIS — I10 Essential (primary) hypertension: Secondary | ICD-10-CM | POA: Diagnosis not present

## 2017-07-13 DIAGNOSIS — Z79899 Other long term (current) drug therapy: Secondary | ICD-10-CM | POA: Diagnosis not present

## 2017-07-13 DIAGNOSIS — M94271 Chondromalacia, right ankle and joints of right foot: Secondary | ICD-10-CM | POA: Diagnosis not present

## 2017-07-13 DIAGNOSIS — M65871 Other synovitis and tenosynovitis, right ankle and foot: Secondary | ICD-10-CM | POA: Diagnosis not present

## 2017-07-13 DIAGNOSIS — M1990 Unspecified osteoarthritis, unspecified site: Secondary | ICD-10-CM | POA: Diagnosis not present

## 2017-07-13 DIAGNOSIS — M216X1 Other acquired deformities of right foot: Secondary | ICD-10-CM | POA: Diagnosis not present

## 2017-07-14 ENCOUNTER — Encounter (HOSPITAL_BASED_OUTPATIENT_CLINIC_OR_DEPARTMENT_OTHER)
Admission: RE | Disposition: A | Discharge status: Home / Self Care | Payer: Self-pay | Source: Ambulatory Visit | Attending: Orthopedic Surgery

## 2017-07-14 ENCOUNTER — Ambulatory Visit (HOSPITAL_BASED_OUTPATIENT_CLINIC_OR_DEPARTMENT_OTHER): Payer: 59 | Admitting: Certified Registered"

## 2017-07-14 ENCOUNTER — Encounter (HOSPITAL_BASED_OUTPATIENT_CLINIC_OR_DEPARTMENT_OTHER): Payer: Self-pay | Admitting: Certified Registered"

## 2017-07-14 ENCOUNTER — Other Ambulatory Visit: Payer: Self-pay

## 2017-07-14 ENCOUNTER — Ambulatory Visit (HOSPITAL_BASED_OUTPATIENT_CLINIC_OR_DEPARTMENT_OTHER)
Admission: RE | Admit: 2017-07-14 | Discharge: 2017-07-14 | Disposition: A | Discharge status: Home / Self Care | Payer: 59 | Source: Ambulatory Visit | Attending: Orthopedic Surgery | Admitting: Orthopedic Surgery

## 2017-07-14 DIAGNOSIS — M1990 Unspecified osteoarthritis, unspecified site: Secondary | ICD-10-CM | POA: Insufficient documentation

## 2017-07-14 DIAGNOSIS — Z9889 Other specified postprocedural states: Secondary | ICD-10-CM

## 2017-07-14 DIAGNOSIS — Z87891 Personal history of nicotine dependence: Secondary | ICD-10-CM | POA: Insufficient documentation

## 2017-07-14 DIAGNOSIS — I10 Essential (primary) hypertension: Secondary | ICD-10-CM | POA: Insufficient documentation

## 2017-07-14 DIAGNOSIS — M65871 Other synovitis and tenosynovitis, right ankle and foot: Secondary | ICD-10-CM | POA: Diagnosis not present

## 2017-07-14 DIAGNOSIS — Z7982 Long term (current) use of aspirin: Secondary | ICD-10-CM | POA: Insufficient documentation

## 2017-07-14 DIAGNOSIS — M94271 Chondromalacia, right ankle and joints of right foot: Secondary | ICD-10-CM | POA: Insufficient documentation

## 2017-07-14 DIAGNOSIS — M216X1 Other acquired deformities of right foot: Secondary | ICD-10-CM | POA: Insufficient documentation

## 2017-07-14 DIAGNOSIS — Z79899 Other long term (current) drug therapy: Secondary | ICD-10-CM | POA: Insufficient documentation

## 2017-07-14 HISTORY — DX: Unspecified osteoarthritis, unspecified site: M19.90

## 2017-07-14 HISTORY — PX: ANKLE ARTHROSCOPY: SHX545

## 2017-07-14 HISTORY — DX: Essential (primary) hypertension: I10

## 2017-07-14 SURGERY — ARTHROSCOPY, ANKLE
Anesthesia: General | Site: Ankle | Laterality: Right

## 2017-07-14 MED ORDER — SODIUM CHLORIDE 0.9 % IR SOLN
Status: DC | PRN
  Administered 2017-07-14: 2000 mL

## 2017-07-14 MED ORDER — BUPIVACAINE-EPINEPHRINE (PF) 0.5% -1:200000 IJ SOLN
INTRAMUSCULAR | Status: AC
  Filled 2017-07-14: qty 30

## 2017-07-14 MED ORDER — DEXAMETHASONE SODIUM PHOSPHATE 10 MG/ML IJ SOLN
INTRAMUSCULAR | Status: DC | PRN
  Administered 2017-07-14: 10 mg via INTRAVENOUS

## 2017-07-14 MED ORDER — ONDANSETRON HCL 4 MG/2ML IJ SOLN
INTRAMUSCULAR | Status: DC | PRN
  Administered 2017-07-14: 4 mg via INTRAVENOUS

## 2017-07-14 MED ORDER — OXYCODONE HCL 5 MG PO TABS: 5.0000 mg | ORAL_TABLET | ORAL | 0 refills | Status: DC | PRN

## 2017-07-14 MED ORDER — PROMETHAZINE HCL 25 MG/ML IJ SOLN: 6.2500 mg | INTRAMUSCULAR | Status: DC | PRN

## 2017-07-14 MED ORDER — SUCCINYLCHOLINE CHLORIDE 200 MG/10ML IV SOSY
PREFILLED_SYRINGE | INTRAVENOUS | Status: AC
  Filled 2017-07-14: qty 10

## 2017-07-14 MED ORDER — MIDAZOLAM HCL 2 MG/2ML IJ SOLN
1.0000 mg | INTRAMUSCULAR | Status: DC | PRN
  Administered 2017-07-14: 1 mg via INTRAVENOUS
  Administered 2017-07-14: 2 mg via INTRAVENOUS

## 2017-07-14 MED ORDER — SENNA 8.6 MG PO TABS: 2.0000 | ORAL_TABLET | Freq: Two times a day (BID) | ORAL | 0 refills | Status: DC

## 2017-07-14 MED ORDER — FENTANYL CITRATE (PF) 100 MCG/2ML IJ SOLN: 25.0000 ug | INTRAMUSCULAR | Status: DC | PRN

## 2017-07-14 MED ORDER — LACTATED RINGERS IV SOLN
INTRAVENOUS | Status: DC
  Administered 2017-07-14 (×2): via INTRAVENOUS

## 2017-07-14 MED ORDER — CEFAZOLIN SODIUM-DEXTROSE 2-4 GM/100ML-% IV SOLN
2.0000 g | INTRAVENOUS | Status: AC
  Administered 2017-07-14: 2 g via INTRAVENOUS

## 2017-07-14 MED ORDER — MIDAZOLAM HCL 2 MG/2ML IJ SOLN
INTRAMUSCULAR | Status: AC
  Filled 2017-07-14: qty 2

## 2017-07-14 MED ORDER — PROPOFOL 10 MG/ML IV BOLUS
INTRAVENOUS | Status: DC | PRN
  Administered 2017-07-14: 150 mg via INTRAVENOUS

## 2017-07-14 MED ORDER — EPHEDRINE 5 MG/ML INJ
INTRAVENOUS | Status: AC
  Filled 2017-07-14: qty 20

## 2017-07-14 MED ORDER — DEXAMETHASONE SODIUM PHOSPHATE 10 MG/ML IJ SOLN
INTRAMUSCULAR | Status: AC
  Filled 2017-07-14: qty 4

## 2017-07-14 MED ORDER — FENTANYL CITRATE (PF) 100 MCG/2ML IJ SOLN
INTRAMUSCULAR | Status: AC
  Filled 2017-07-14: qty 2

## 2017-07-14 MED ORDER — DOCUSATE SODIUM 100 MG PO CAPS: 100.0000 mg | ORAL_CAPSULE | Freq: Two times a day (BID) | ORAL | 0 refills | Status: DC

## 2017-07-14 MED ORDER — PROPOFOL 500 MG/50ML IV EMUL
INTRAVENOUS | Status: AC
  Filled 2017-07-14: qty 50

## 2017-07-14 MED ORDER — CHLORHEXIDINE GLUCONATE 4 % EX LIQD: 60.0000 mL | Freq: Once | CUTANEOUS | Status: DC

## 2017-07-14 MED ORDER — FENTANYL CITRATE (PF) 100 MCG/2ML IJ SOLN
50.0000 ug | INTRAMUSCULAR | Status: DC | PRN
  Administered 2017-07-14: 50 ug via INTRAVENOUS
  Administered 2017-07-14: 25 ug via INTRAVENOUS

## 2017-07-14 MED ORDER — LIDOCAINE 2% (20 MG/ML) 5 ML SYRINGE
INTRAMUSCULAR | Status: AC
  Filled 2017-07-14: qty 20

## 2017-07-14 MED ORDER — LIDOCAINE HCL (CARDIAC) 20 MG/ML IV SOLN
INTRAVENOUS | Status: DC | PRN
  Administered 2017-07-14: 60 mg via INTRAVENOUS

## 2017-07-14 MED ORDER — CEFAZOLIN SODIUM-DEXTROSE 2-4 GM/100ML-% IV SOLN
INTRAVENOUS | Status: AC
  Filled 2017-07-14: qty 100

## 2017-07-14 MED ORDER — BUPIVACAINE-EPINEPHRINE (PF) 0.5% -1:200000 IJ SOLN
INTRAMUSCULAR | Status: DC | PRN
  Administered 2017-07-14: 30 mL via PERINEURAL

## 2017-07-14 MED ORDER — SCOPOLAMINE 1 MG/3DAYS TD PT72: 1.0000 | MEDICATED_PATCH | Freq: Once | TRANSDERMAL | Status: DC | PRN

## 2017-07-14 MED ORDER — SODIUM CHLORIDE 0.9 % IV SOLN: INTRAVENOUS | Status: DC

## 2017-07-14 MED ORDER — ONDANSETRON HCL 4 MG/2ML IJ SOLN
INTRAMUSCULAR | Status: AC
  Filled 2017-07-14: qty 14

## 2017-07-14 SURGICAL SUPPLY — 81 items
BANDAGE ACE 4X5 VEL STRL LF (GAUZE/BANDAGES/DRESSINGS) ×2 IMPLANT
BANDAGE ESMARK 6X9 LF (GAUZE/BANDAGES/DRESSINGS) IMPLANT
BLADE CUDA 2.0 (BLADE) IMPLANT
BLADE CUDA GRT WHITE 3.5 (BLADE) IMPLANT
BLADE CUDA SHAVER 3.5 (BLADE) IMPLANT
BLADE CUTTER GATOR 3.5 (BLADE) ×2 IMPLANT
BLADE SURG 15 STRL LF DISP TIS (BLADE) ×1 IMPLANT
BLADE SURG 15 STRL SS (BLADE) ×3
BNDG CMPR 9X6 STRL LF SNTH (GAUZE/BANDAGES/DRESSINGS)
BNDG COHESIVE 4X5 TAN STRL (GAUZE/BANDAGES/DRESSINGS) IMPLANT
BNDG COHESIVE 6X5 TAN STRL LF (GAUZE/BANDAGES/DRESSINGS) IMPLANT
BNDG ESMARK 6X9 LF (GAUZE/BANDAGES/DRESSINGS)
BNDG GAUZE ELAST 4 BULKY (GAUZE/BANDAGES/DRESSINGS) ×3 IMPLANT
BOOT STEPPER DURA LG (SOFTGOODS) IMPLANT
BOOT STEPPER DURA MED (SOFTGOODS) IMPLANT
BOOT STEPPER DURA SM (SOFTGOODS) ×2 IMPLANT
BUR 3.5 LG SPHERICAL (BURR) IMPLANT
BUR CUDA 2.9 (BURR) IMPLANT
BUR CUDA 2.9MM (BURR)
BUR FULL RADIUS 2.9 (BURR) IMPLANT
BUR FULL RADIUS 2.9MM (BURR)
BUR GATOR 2.9 (BURR) ×1 IMPLANT
BUR GATOR 2.9MM (BURR) ×1
BUR OVAL 4.0 (BURR) IMPLANT
BUR SPHERICAL 2.9 (BURR) IMPLANT
BUR SPHERICAL 2.9MM (BURR)
BUR VERTEX HOODED 4.5 (BURR) IMPLANT
BURR 3.5 LG SPHERICAL (BURR)
BURR 3.5MM LG SPHERICAL (BURR)
CHLORAPREP W/TINT 26ML (MISCELLANEOUS) ×3 IMPLANT
CUFF TOURNIQUET SINGLE 34IN LL (TOURNIQUET CUFF) ×2 IMPLANT
DRAPE EXTREMITY T 121X128X90 (DRAPE) ×3 IMPLANT
DRAPE OEC MINIVIEW 54X84 (DRAPES) IMPLANT
DRAPE U-SHAPE 47X51 STRL (DRAPES) ×3 IMPLANT
DRSG MEPITEL 4X7.2 (GAUZE/BANDAGES/DRESSINGS) ×3 IMPLANT
DRSG PAD ABDOMINAL 8X10 ST (GAUZE/BANDAGES/DRESSINGS) IMPLANT
ELECT REM PT RETURN 9FT ADLT (ELECTROSURGICAL) ×3
ELECTRODE REM PT RTRN 9FT ADLT (ELECTROSURGICAL) ×1 IMPLANT
GAUZE SPONGE 4X4 12PLY STRL (GAUZE/BANDAGES/DRESSINGS) ×3 IMPLANT
GLOVE BIO SURGEON STRL SZ 6.5 (GLOVE) ×1 IMPLANT
GLOVE BIO SURGEON STRL SZ8 (GLOVE) ×3 IMPLANT
GLOVE BIO SURGEONS STRL SZ 6.5 (GLOVE) ×1
GLOVE BIOGEL PI IND STRL 7.0 (GLOVE) IMPLANT
GLOVE BIOGEL PI IND STRL 8 (GLOVE) ×2 IMPLANT
GLOVE BIOGEL PI INDICATOR 7.0 (GLOVE) ×6
GLOVE BIOGEL PI INDICATOR 8 (GLOVE) ×4
GLOVE ECLIPSE 6.5 STRL STRAW (GLOVE) ×2 IMPLANT
GLOVE ECLIPSE 8.0 STRL XLNG CF (GLOVE) ×3 IMPLANT
GOWN STRL REUS W/ TWL LRG LVL3 (GOWN DISPOSABLE) ×1 IMPLANT
GOWN STRL REUS W/ TWL XL LVL3 (GOWN DISPOSABLE) ×2 IMPLANT
GOWN STRL REUS W/TWL LRG LVL3 (GOWN DISPOSABLE) ×6
GOWN STRL REUS W/TWL XL LVL3 (GOWN DISPOSABLE) ×6
MANIFOLD NEPTUNE II (INSTRUMENTS) ×3 IMPLANT
PACK ARTHROSCOPY DSU (CUSTOM PROCEDURE TRAY) ×3 IMPLANT
PACK BASIN DAY SURGERY FS (CUSTOM PROCEDURE TRAY) ×3 IMPLANT
PAD CAST 4YDX4 CTTN HI CHSV (CAST SUPPLIES) ×1 IMPLANT
PADDING CAST ABS 4INX4YD NS (CAST SUPPLIES)
PADDING CAST ABS COTTON 4X4 ST (CAST SUPPLIES) IMPLANT
PADDING CAST COTTON 4X4 STRL (CAST SUPPLIES) ×3
PADDING CAST COTTON 6X4 STRL (CAST SUPPLIES) IMPLANT
PENCIL BUTTON HOLSTER BLD 10FT (ELECTRODE) IMPLANT
SANITIZER HAND PURELL 535ML FO (MISCELLANEOUS) ×3 IMPLANT
SET IRRIG Y TYPE TUR BLADDER L (SET/KITS/TRAYS/PACK) IMPLANT
SLEEVE SCD COMPRESS KNEE MED (MISCELLANEOUS) ×3 IMPLANT
SPLINT FAST PLASTER 5X30 (CAST SUPPLIES)
SPLINT PLASTER CAST FAST 5X30 (CAST SUPPLIES) IMPLANT
SPONGE LAP 18X18 X RAY DECT (DISPOSABLE) ×2 IMPLANT
STOCKINETTE 6  STRL (DRAPES) ×2
STOCKINETTE 6 STRL (DRAPES) ×1 IMPLANT
STRAP ANKLE FOOT DISTRACTOR (ORTHOPEDIC SUPPLIES) ×3 IMPLANT
SUT ETHILON 3 0 PS 1 (SUTURE) ×3 IMPLANT
SUT MNCRL AB 3-0 PS2 18 (SUTURE) IMPLANT
SUT VIC AB 0 SH 27 (SUTURE) IMPLANT
SUT VIC AB 2-0 SH 27 (SUTURE)
SUT VIC AB 2-0 SH 27XBRD (SUTURE) IMPLANT
SYR BULB 3OZ (MISCELLANEOUS) IMPLANT
TOWEL OR 17X24 6PK STRL BLUE (TOWEL DISPOSABLE) ×5 IMPLANT
TUBE CONNECTING 20'X1/4 (TUBING) ×1
TUBE CONNECTING 20X1/4 (TUBING) ×1 IMPLANT
TUBING ARTHRO INFLOW-ONLY STRL (TUBING) ×3 IMPLANT
WATER STERILE IRR 1000ML POUR (IV SOLUTION) ×3 IMPLANT

## 2017-07-14 NOTE — Discharge Instructions (Addendum)
Brianna ArthursJohn Hewitt, MD [facility]  Please read the following information regarding your care after surgery.  Medications  You only need a prescription for the narcotic pain medicine (ex. oxycodone, Percocet, Norco).  All of the other medicines listed below are available over the counter. X Aleve 2 pills twice a day for the first 3 days after surgery. X acetominophen (Tylenol) 650 mg every 4-6 hours as you need for minor to moderate pain X oxycodone as prescribed for severe pain  Narcotic pain medicine (ex. oxycodone, Percocet, Vicodin) will cause constipation.  To prevent this problem, take the following medicines while you are taking any pain medicine. X docusate sodium (Colace) 100 mg twice a day X senna (Senokot) 2 tablets twice a day  Weight Bearing X Bear weight when you are able on your operated leg or foot in the CAM boot.   Cast / Splint / Dressing X Remove your dressing 3 days after surgery and cover the incisions with dry dressings.    After your dressing, cast or splint is removed; you may shower, but do not soak or scrub the wound.  Allow the water to run over it, and then gently pat it dry.  Swelling It is normal for you to have swelling where you had surgery.  To reduce swelling and pain, keep your toes above your nose for at least 3 days after surgery.  It may be necessary to keep your foot or leg elevated for several weeks.  If it hurts, it should be elevated.  Follow Up Call my office at [phone] when you are discharged from the hospital or surgery center to schedule an appointment to be seen two weeks after surgery.  Call my office at [phone] if you develop a fever >101.5 F, nausea, vomiting, bleeding from the surgical site or severe pain.      Post Anesthesia Home Care Instructions  Activity: Get plenty of rest for the remainder of the day. A responsible individual must stay with you for 24 hours following the procedure.  For the next 24 hours,  DO NOT: -Drive a car -Advertising copywriterperate machinery -Drink alcoholic beverages -Take any medication unless instructed by your physician -Make any legal decisions or sign important papers.  Meals: Start with liquid foods such as gelatin or soup. Progress to regular foods as tolerated. Avoid greasy, spicy, heavy foods. If nausea and/or vomiting occur, drink only clear liquids until the nausea and/or vomiting subsides. Call your physician if vomiting continues.  Special Instructions/Symptoms: Your throat may feel dry or sore from the anesthesia or the breathing tube placed in your throat during surgery. If this causes discomfort, gargle with warm salt water. The discomfort should disappear within 24 hours.  If you had a scopolamine patch placed behind your ear for the management of post- operative nausea and/or vomiting:  1. The medication in the patch is effective for 72 hours, after which it should be removed.  Wrap patch in a tissue and discard in the trash. Wash hands thoroughly with soap and water. 2. You may remove the patch earlier than 72 hours if you experience unpleasant side effects which may include dry mouth, dizziness or visual disturbances. 3. Avoid touching the patch. Wash your hands with soap and water after contact with the patch.     Regional Anesthesia Blocks  1. Numbness or the inability to move the "blocked" extremity may last from 3-48 hours after placement. The length of time depends on the medication injected and your individual response to  the medication. If the numbness is not going away after 48 hours, call your surgeon.  2. The extremity that is blocked will need to be protected until the numbness is gone and the  Strength has returned. Because you cannot feel it, you will need to take extra care to avoid injury. Because it may be weak, you may have difficulty moving it or using it. You may not know what position it is in without looking at it while the block is in  effect.  3. For blocks in the legs and feet, returning to weight bearing and walking needs to be done carefully. You will need to wait until the numbness is entirely gone and the strength has returned. You should be able to move your leg and foot normally before you try and bear weight or walk. You will need someone to be with you when you first try to ensure you do not fall and possibly risk injury.  4. Bruising and tenderness at the needle site are common side effects and will resolve in a few days.  5. Persistent numbness or new problems with movement should be communicated to the surgeon or the [facility] [phone] [facility] [phone]).

## 2017-07-14 NOTE — Anesthesia Postprocedure Evaluation (Signed)
Anesthesia Post Note  Patient: Conard NovakSharon F Kabir  Procedure(s) Performed: Ankle Arthroscopy  with Extensive Debridement (Right Ankle)     Patient location during evaluation: PACU Anesthesia Type: General Level of consciousness: awake and alert Pain management: pain level controlled Vital Signs Assessment: post-procedure vital signs reviewed and stable Respiratory status: spontaneous breathing, nonlabored ventilation and respiratory function stable Cardiovascular status: blood pressure returned to baseline and stable Postop Assessment: no apparent nausea or vomiting Anesthetic complications: no    Last Vitals:  Vitals:   07/14/17 0915 07/14/17 1012  BP: 112/90 121/89  Pulse: 94 85  Resp: 18   Temp:  36.5 C  SpO2: 100% 100%    Last Pain:  Vitals:   07/14/17 1012  TempSrc: Oral  PainSc: 0-No pain                 Cecile HearingStephen Edward Lakshya Mcgillicuddy

## 2017-07-14 NOTE — Transfer of Care (Signed)
Immediate Anesthesia Transfer of Care Note  Patient: Brianna Calderon  Procedure(s) Performed: Ankle Arthroscopy  with Extensive Debridement (Right Ankle)  Patient Location: PACU  Anesthesia Type:GA combined with regional for post-op pain  Level of Consciousness: awake, alert , oriented and patient cooperative  Airway & Oxygen Therapy: Patient Spontanous Breathing and Patient connected to face mask oxygen  Post-op Assessment: Report given to RN and Post -op Vital signs reviewed and stable  Post vital signs: Reviewed and stable  Last Vitals:  Vitals:   07/14/17 0802 07/14/17 0803  BP:    Pulse: 68 68  Resp: 14 13  Temp:    SpO2: 100% 100%    Last Pain:  Vitals:   07/14/17 0704  TempSrc: Oral  PainSc: 5       Patients Stated Pain Goal: 2 (07/14/17 0704)  Complications: No apparent anesthesia complications

## 2017-07-14 NOTE — Op Note (Signed)
07/14/2017  9:00 AM  PATIENT:  Brianna Calderon  57 y.o. female  PRE-OPERATIVE DIAGNOSIS:  Right ankle anterior impingement  POST-OPERATIVE DIAGNOSIS: 1.  Right ankle synovitis at the medial, anterior and lateral gutters      2.  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  She complains of pain at the anteromedial and anterolateral aspects of the ankle joint.  She has failed nonoperative treatment to date including activity modification, oral anti-inflammatories and bracing.  She presents today for ankle arthroscopy and likely extensive debridement.  The risks and benefits of the alternative treatment options have been discussed in detail.  The patient wishes to proceed with surgery and specifically understands risks of bleeding, infection, nerve damage, blood clots, need for additional surgery, amputation and death.  PROCEDURE IN DETAIL:  After pre operative consent was obtained, and the correct operative site was identified, the patient was brought to the operating room and placed supine on the OR table.  Anesthesia was administered.  Pre-operative antibiotics were administered.  A surgical timeout was taken.  The right lower remedy was prepped and draped in standard sterile fashion.  The extremity was exsanguinated and tourniquet  was inflated to 250 mmHg.  A traction strap was applied to the foot an anteromedial arthroscopy portal was established under direct vision.  The arthroscope was inserted into the ankle joint.  Immediately evident was significant synovitis of the medial and anterior gutters.  An anterolateral arthroscopy portal was established using the nick and spread technique.  The arthroscopic shaver was inserted into the joint.  The anterior and medial gutter synovitis was resected with the shaver.  This allowed improved visualization at the lateral gutter.  There was extensive scarring between the fibula and the lateral talar dome in the vicinity of the syndesmosis.  A large amount of scar was debrided with the shaver freeing up the lateral wall of the talus from the fibula.  An osteochondral lesion was identified in this area.  It measured approximate 1 cm x 5 mm.  The unstable cartilage was resected with the shaver.  There was no significant bone exposure.  The tibial plafond and talar dome were noted to have diffuse grade I and II chondromalacia.  The posterior gutter was noted to be healthy with no evidence of loose body or significant synovitis.  The syndesmosis was probed and was noted to be stable.  The medial talar dome was again inspected and was noted to have a small osteochondral lesion at the anterior aspect.  This area had stable cartilage with no exposed bone.  The ankle was then maximally dorsiflexed and there was no evidence of impingement of any soft tissue or bony structures at the anterior joint line.  All arthroscopic instruments were then removed.  Incisions were closed with nylon.  Sterile dressings were  applied followed by a cam walker boot.  The patient was awakened from anesthesia and transported to the recovery room in stable condition.  FOLLOW UP PLAN: Weightbearing as tolerated on the right foot in a cam walker boot.  Follow-up in 2 weeks for suture removal and to initiate therapy as  needed.    Alfredo MartinezJustin Ollis PA-C was present and scrubbed for the duration of the operative case. His assistance assistance was essential in positioning the patient, prepping and draping, gaining maintaining exposure, performing the operation, closing and dressing the wounds and applying the splint.

## 2017-07-14 NOTE — Progress Notes (Signed)
Assisted Dr. Turk with right, ultrasound guided, popliteal/saphenous block. Side rails up, monitors on throughout procedure. See vital signs in flow sheet. Tolerated Procedure well. 

## 2017-07-14 NOTE — Anesthesia Procedure Notes (Signed)
Anesthesia Regional Block: Popliteal block   Pre-Anesthetic Checklist: ,, timeout performed, Correct Patient, Correct Site, Correct Laterality, Correct Procedure, Correct Position, site marked, Risks and benefits discussed,  Surgical consent,  Pre-op evaluation,  At surgeon's request and post-op pain management  Laterality: Right  Prep: chloraprep       Needles:  Injection technique: Single-shot  Needle Type: Echogenic Needle     Needle Length: 9cm  Needle Gauge: 21     Additional Needles:   Procedures:,,,, ultrasound used (permanent image in chart),,,,  Narrative:  Start time: 07/14/2017 7:50 AM End time: 07/14/2017 8:00 AM Injection made incrementally with aspirations every 5 mL.  Performed by: Personally  Anesthesiologist: Cecile Hearingurk, Phylis Javed Edward, MD  Additional Notes: No pain on injection. No increased resistance to injection. Injection made in 5cc increments.  Good needle visualization.  Patient tolerated procedure well.  Right popliteal/saphenous nerve block.

## 2017-07-14 NOTE — Anesthesia Procedure Notes (Signed)
Procedure Name: LMA Insertion Date/Time: 07/14/2017 8:14 AM Performed by: Sheryn BisonBlocker, Fredericka Bottcher D, CRNA Pre-anesthesia Checklist: Patient identified, Emergency Drugs available, Suction available and Patient being monitored Patient Re-evaluated:Patient Re-evaluated prior to induction Oxygen Delivery Method: Circle system utilized Preoxygenation: Pre-oxygenation with 100% oxygen Induction Type: IV induction Ventilation: Mask ventilation without difficulty LMA: LMA inserted LMA Size: 3.0 Number of attempts: 1 Airway Equipment and Method: Bite block Placement Confirmation: positive ETCO2 Tube secured with: Tape Dental Injury: Teeth and Oropharynx as per pre-operative assessment

## 2017-07-14 NOTE — Anesthesia Preprocedure Evaluation (Signed)
Anesthesia Evaluation  Patient identified by MRN, date of birth, ID band Patient awake    Reviewed: Allergy & Precautions, NPO status , Patient's Chart, lab work & pertinent test results  Airway Mallampati: II  TM Distance: >3 FB Neck ROM: Full    Dental  (+) Teeth Intact, Dental Advisory Given   Pulmonary former smoker,    Pulmonary exam normal breath sounds clear to auscultation       Cardiovascular hypertension, Pt. on medications Normal cardiovascular exam Rhythm:Regular Rate:Normal     Neuro/Psych negative neurological ROS  negative psych ROS   GI/Hepatic negative GI ROS, Neg liver ROS,   Endo/Other  negative endocrine ROS  Renal/GU negative Renal ROS     Musculoskeletal  (+) Arthritis ,   Abdominal   Peds  Hematology negative hematology ROS (+)   Anesthesia Other Findings Day of surgery medications reviewed with the patient.  Reproductive/Obstetrics                             Anesthesia Physical Anesthesia Plan  ASA: II  Anesthesia Plan: General   Post-op Pain Management:    Induction: Intravenous  PONV Risk Score and Plan: 3 and Midazolam, Dexamethasone and Ondansetron  Airway Management Planned: LMA  Additional Equipment:   Intra-op Plan:   Post-operative Plan: Extubation in OR  Informed Consent: I have reviewed the patients History and Physical, chart, labs and discussed the procedure including the risks, benefits and alternatives for the proposed anesthesia with the patient or authorized representative who has indicated his/her understanding and acceptance.   Dental advisory given  Plan Discussed with: CRNA  Anesthesia Plan Comments: (Risks/benefits of general anesthesia discussed with patient including risk of damage to teeth, lips, gum, and tongue, nausea/vomiting, allergic reactions to medications, and the possibility of heart attack, stroke and death.  All  patient questions answered.  Patient wishes to proceed.)        Anesthesia Quick Evaluation

## 2017-07-14 NOTE — H&P (Signed)
Brianna NovakSharon F Calderon is an 57 y.o. female.   Chief Complaint: Right ankle pain HPI: The patient is a 57 year old woman with past medical history significant for smoking.  She has recently quit.  She complains of ankle pain that is been bothersome for many months.  Signs and symptoms are consistent with synovitis and anterior impingement.  She has failed nonoperative treatment to date including activity modification, bracing, oral anti-inflammatories and shoe wear modification.  She presents today for arthroscopy of her right ankle with extensive debridement.  Past Medical History:  Diagnosis Date  . Arthritis   . Hypertension     Past Surgical History:  Procedure Laterality Date  . SHOULDER CAPSULORRHAPHY    . WRIST FLEXION TENDON TENOTOMIES AND PROXIMAL CORPECTOMY W/ WRIST ARTHRODESIS&ILIAC CREST BONE GRAFT      History reviewed. No pertinent family history. Social History:  reports that she has quit smoking. she has never used smokeless tobacco. She reports that she drinks alcohol. She reports that she does not use drugs.  Allergies: No Known Allergies  Medications Prior to Admission  Medication Sig Dispense Refill  . aspirin 325 MG tablet Take 325 mg by mouth daily.    Marland Kitchen. olmesartan (BENICAR) 40 MG tablet Take 40 mg by mouth daily.    . traMADol (ULTRAM) 50 MG tablet Take by mouth every 6 (six) hours as needed.      No results found for this or any previous visit (from the past 48 hour(s)). No results found.  ROS no recent fever, chills, nausea, vomiting or changes in her appetite.  Blood pressure 112/62, pulse 70, temperature 97.9 F (36.6 C), temperature source Oral, resp. rate 18, height 5\' 5"  (1.651 m), weight 63.8 kg (140 lb 9.6 oz), SpO2 99 %. Physical Exam  Well-nourished well-developed woman in no apparent distress.  Alert and oriented x4.  Mood and affect are normal.  Extra ocular motions are intact.  Respirations are unlabored.  Her gait is antalgic to the right.  The right  ankle today as healthy and intact skin.  No significant swelling or effusion.  Sensibility to light touch is intact dorsally and plantarly at the forefoot.  5 out of 5 strength in plantar flexion and dorsi flexion of the ankle.  She is tender to palpation across the anterior joint line both medially and laterally.  Assessment/Plan Right ankle anterior impingement -to the operating room today for ankle arthroscopy and extensive debridement.  The risks and benefits of the alternative treatment options have been discussed in detail.  The patient wishes to proceed with surgery and specifically understands risks of bleeding, infection, nerve damage, blood clots, need for additional surgery, amputation and death.   Brianna Calderon, Brianna Medlin, MD 07/14/2017, 7:29 AM

## 2017-07-15 ENCOUNTER — Encounter (HOSPITAL_BASED_OUTPATIENT_CLINIC_OR_DEPARTMENT_OTHER): Payer: Self-pay | Admitting: Orthopedic Surgery

## 2017-07-15 NOTE — Addendum Note (Signed)
Addendum  created 07/15/17 1052 by Ronnette HilaPayne, Talon Regala D, CRNA   Charge Capture section accepted

## 2021-09-22 ENCOUNTER — Emergency Department (HOSPITAL_BASED_OUTPATIENT_CLINIC_OR_DEPARTMENT_OTHER): Payer: BC Managed Care – PPO

## 2021-09-22 ENCOUNTER — Emergency Department (HOSPITAL_BASED_OUTPATIENT_CLINIC_OR_DEPARTMENT_OTHER)
Admission: EM | Admit: 2021-09-22 | Discharge: 2021-09-22 | Disposition: A | Discharge status: Home / Self Care | Payer: BC Managed Care – PPO | Attending: Emergency Medicine | Admitting: Emergency Medicine

## 2021-09-22 ENCOUNTER — Other Ambulatory Visit: Payer: Self-pay

## 2021-09-22 ENCOUNTER — Encounter (HOSPITAL_BASED_OUTPATIENT_CLINIC_OR_DEPARTMENT_OTHER): Payer: Self-pay | Admitting: Emergency Medicine

## 2021-09-22 DIAGNOSIS — I10 Essential (primary) hypertension: Secondary | ICD-10-CM | POA: Diagnosis not present

## 2021-09-22 DIAGNOSIS — Z79899 Other long term (current) drug therapy: Secondary | ICD-10-CM | POA: Diagnosis not present

## 2021-09-22 DIAGNOSIS — Z7982 Long term (current) use of aspirin: Secondary | ICD-10-CM | POA: Insufficient documentation

## 2021-09-22 DIAGNOSIS — R072 Precordial pain: Secondary | ICD-10-CM

## 2021-09-22 DIAGNOSIS — R739 Hyperglycemia, unspecified: Secondary | ICD-10-CM | POA: Insufficient documentation

## 2021-09-22 LAB — CBC WITH DIFFERENTIAL/PLATELET
Abs Immature Granulocytes: 0.04 10*3/uL (ref 0.00–0.07)
Basophils Absolute: 0.1 10*3/uL (ref 0.0–0.1)
Basophils Relative: 1 %
Eosinophils Absolute: 0.1 10*3/uL (ref 0.0–0.5)
Eosinophils Relative: 1 %
HCT: 50 % — ABNORMAL HIGH (ref 36.0–46.0)
Hemoglobin: 16.9 g/dL — ABNORMAL HIGH (ref 12.0–15.0)
Immature Granulocytes: 0 %
Lymphocytes Relative: 13 %
Lymphs Abs: 1.4 10*3/uL (ref 0.7–4.0)
MCH: 31.2 pg (ref 26.0–34.0)
MCHC: 33.8 g/dL (ref 30.0–36.0)
MCV: 92.4 fL (ref 80.0–100.0)
Monocytes Absolute: 0.4 10*3/uL (ref 0.1–1.0)
Monocytes Relative: 4 %
Neutro Abs: 8.1 10*3/uL — ABNORMAL HIGH (ref 1.7–7.7)
Neutrophils Relative %: 81 %
Platelets: 294 10*3/uL (ref 150–400)
RBC: 5.41 MIL/uL — ABNORMAL HIGH (ref 3.87–5.11)
RDW: 12.9 % (ref 11.5–15.5)
WBC: 10.1 10*3/uL (ref 4.0–10.5)
nRBC: 0 % (ref 0.0–0.2)

## 2021-09-22 LAB — COMPREHENSIVE METABOLIC PANEL
ALT: 17 U/L (ref 0–44)
AST: 18 U/L (ref 15–41)
Albumin: 4.2 g/dL (ref 3.5–5.0)
Alkaline Phosphatase: 77 U/L (ref 38–126)
Anion gap: 10 (ref 5–15)
BUN: 12 mg/dL (ref 8–23)
CO2: 24 mmol/L (ref 22–32)
Calcium: 9.5 mg/dL (ref 8.9–10.3)
Chloride: 101 mmol/L (ref 98–111)
Creatinine, Ser: 0.67 mg/dL (ref 0.44–1.00)
GFR, Estimated: >60 mL/min (ref >60)
Glucose, Bld: 145 mg/dL — ABNORMAL HIGH (ref 70–99)
Potassium: 3.8 mmol/L (ref 3.5–5.1)
Sodium: 135 mmol/L (ref 135–145)
Total Bilirubin: 0.7 mg/dL (ref 0.3–1.2)
Total Protein: 7.4 g/dL (ref 6.5–8.1)

## 2021-09-22 LAB — TROPONIN I (HIGH SENSITIVITY)
Troponin I (High Sensitivity): 4 ng/L (ref <18)
Troponin I (High Sensitivity): 4 ng/L (ref <18)

## 2021-09-22 MED ORDER — PANTOPRAZOLE SODIUM 40 MG IV SOLR
40.0000 mg | Freq: Once | INTRAVENOUS | Status: AC
  Administered 2021-09-22: 40 mg via INTRAVENOUS
  Filled 2021-09-22: qty 10

## 2021-09-22 NOTE — Discharge Instructions (Signed)
Would recommend making an appointment to follow-up with cardiology they may want to do echocardiogram and nonexercise stress test to see evaluate the heart a little further.  Today's work-up without any acute findings.  Also would recommend a trial of over-the-counter Pepcid for the next 2 weeks.  Would make an appointment follow-up with your primary care doctor regarding blood sugar that was a little elevated here today and your blood pressure that was elevated.  Return for any new or worse symptoms. ?

## 2021-09-22 NOTE — ED Triage Notes (Signed)
Pt here from home with c/o central chest pain non radiating with htn some slight nausea no vomiting , no sob  ?

## 2021-09-22 NOTE — ED Provider Notes (Addendum)
?[facility] EMERGENCY DEPARTMENT ?Provider Note ? ? ?CSN: [id number] ?Arrival date & time: 09/22/21  6962 ? ?  ? ?History ? ?No chief complaint on file. ? ? ?Brianna Calderon is a 61 y.o. female. ? ?Patient with a burning sensation in the chest for about a week.  But it for this morning awoke with pressure in the substernal area.  Associated with some intermittent nausea no shortness of breath no lower extremity swelling.  No radiation of the pain.  Patient has a known longstanding history of hypertension.  Currently taking my Cardizem and has taken that for years.  She did note that her blood pressure was elevated as well.  Patient last seen by her primary care doctor in February. ? ? ?  ? ?Home Medications ?Prior to Admission medications   ?Medication Sig Start Date End Date Taking? Authorizing Provider  ?aspirin 325 MG tablet Take 325 mg by mouth daily.    [provider]  ?docusate sodium (COLACE) 100 MG capsule Take 1 capsule (100 mg total) by mouth 2 (two) times daily. While taking narcotic pain medicine. 07/14/17   Jacinta Shoe, PA-C  ?olmesartan (BENICAR) 40 MG tablet Take 40 mg by mouth daily.    [provider]  ?oxyCODONE (ROXICODONE) 5 MG immediate release tablet Take 1 tablet (5 mg total) by mouth every 4 (four) hours as needed for moderate pain or severe pain. For no more than 5 days. 07/14/17   Jacinta Shoe, PA-C  ?senna (SENOKOT) 8.6 MG TABS tablet Take 2 tablets (17.2 mg total) by mouth 2 (two) times daily. 07/14/17   Jacinta Shoe, PA-C  ?   ? ?Allergies    ?Patient has no known allergies.   ? ?Review of Systems   ?Review of Systems  ?Constitutional:  Negative for chills and fever.  ?HENT:  Negative for ear pain and sore throat.   ?Eyes:  Negative for pain and visual disturbance.  ?Respiratory:  Negative for cough and shortness of breath.   ?Cardiovascular:  Positive for chest pain. Negative for palpitations and leg swelling.  ?Gastrointestinal:  Positive for  nausea. Negative for abdominal pain and vomiting.  ?Genitourinary:  Negative for dysuria and hematuria.  ?Musculoskeletal:  Negative for arthralgias and back pain.  ?Skin:  Negative for color change and rash.  ?Neurological:  Negative for seizures and syncope.  ?All other systems reviewed and are negative. ? ?Physical Exam ?Updated Vital Signs ?BP (!) 163/99   Pulse 76   Temp 98.9 ?F (37.2 ?C) (Oral)   Resp 18   SpO2 97%  ?Physical Exam ?Vitals and nursing note reviewed.  ?Constitutional:   ?   General: She is not in acute distress. ?   Appearance: Normal appearance. She is well-developed.  ?HENT:  ?   Head: Normocephalic and atraumatic.  ?Eyes:  ?   Extraocular Movements: Extraocular movements intact.  ?   Conjunctiva/sclera: Conjunctivae normal.  ?   Pupils: Pupils are equal, round, and reactive to light.  ?Cardiovascular:  ?   Rate and Rhythm: Normal rate and regular rhythm.  ?   Heart sounds: No murmur heard. ?Pulmonary:  ?   Effort: Pulmonary effort is normal. No respiratory distress.  ?   Breath sounds: Normal breath sounds. No wheezing, rhonchi or rales.  ?Chest:  ?   Chest wall: No tenderness.  ?Abdominal:  ?   Palpations: Abdomen is soft.  ?   Tenderness: There is no abdominal tenderness.  ?Musculoskeletal:     ?  General: No swelling.  ?   Cervical back: Normal range of motion and neck supple.  ?Skin: ?   General: Skin is warm and dry.  ?   Capillary Refill: Capillary refill takes less than 2 seconds.  ?Neurological:  ?   Mental Status: She is alert.  ?Psychiatric:     ?   Mood and Affect: Mood normal.  ? ? ?ED Results / Procedures / Treatments   ?Labs ?(all labs ordered are listed, but only abnormal results are displayed) ?Labs Reviewed  ?COMPREHENSIVE METABOLIC PANEL - Abnormal; Notable for the following components:  ?    Result Value  ? Glucose, Bld 145 (*)   ? All other components within normal limits  ?CBC WITH DIFFERENTIAL/PLATELET - Abnormal; Notable for the following components:  ? RBC 5.41  (*)   ? Hemoglobin 16.9 (*)   ? HCT 50.0 (*)   ? Neutro Abs 8.1 (*)   ? All other components within normal limits  ?TROPONIN I (HIGH SENSITIVITY)  ?TROPONIN I (HIGH SENSITIVITY)  ? ? ?EKG ?EKG Interpretation ? ?Date/Time:  Tuesday September 22 2021 07:39:22 EDT ?Ventricular Rate:  96 ?PR Interval:  135 ?QRS Duration: 90 ?QT Interval:  358 ?QTC Calculation: 453 ?R Axis:   84 ?Text Interpretation: Sinus rhythm Consider right atrial enlargement Anterior infarct, old Borderline ST depression, diffuse leads New since previous tracing Confirmed by Vanetta MuldersZackowski, Manda Holstad [phone]) on 09/22/2021 7:45:19 AM ? ?Radiology ?DG Chest Port 1 View ? ?Result Date: 09/22/2021 ?CLINICAL DATA:  Chest pain with burning and pressure since 4 a.m. EXAM: PORTABLE CHEST 1 VIEW COMPARISON:  None. FINDINGS: Normal heart size and mediastinal contours. No acute infiltrate or edema. No effusion or pneumothorax. No acute osseous findings. IMPRESSION: No active disease. Electronically Signed   By: Tiburcio PeaJonathan  Watts M.D.   On: 09/22/2021 08:10   ? ?Procedures ?Procedures  ? ? ?Medications Ordered in ED ?Medications  ?pantoprazole (PROTONIX) injection 40 mg (40 mg Intravenous Given 09/22/21 1009)  ? ? ?ED Course/ Medical Decision Making/ A&P ?  ?                        ?Medical Decision Making ?Amount and/or Complexity of Data Reviewed ?Labs: ordered. ?Radiology: ordered. ? ?Risk ?Prescription drug management. ? ? ?EKG has some ST segment depression out laterally.  That is new for her since 2019.  Patient nontoxic no acute distress. ? ?Patient will require delta troponins.  Will get chest x-ray CBC and complete metabolic panel. ? ?Patient's labs without significant abnormalities other than blood sugar up a little bit at 145.  Patient's blood pressures continue to be up here.  Initial troponin very normal at 4 chest x-ray negative.  CBC without a leukocytosis.  No anemia. ? ?While waiting for the delta troponin we will give patient a dose of IV Protonix to see if this  helps with the burning sensation. ? ?Delta troponin is also 4.  No significant changes there.  We will have patient follow-up with cardiology.  We will have her do a trial of Pepcid for 2 weeks.  Over-the-counter Pepcid.  Protonix made no difference. ? ?Patient will follow-up with her primary care doctor regarding the elevated blood sugar and elevated blood pressure here today. ? ? ?Final Clinical Impression(s) / ED Diagnoses ?Final diagnoses:  ?Precordial pain  ?Primary hypertension  ?Hyperglycemia  ? ? ?Rx / DC Orders ?ED Discharge Orders   ? ? None  ? ?  ? ? ?  ?  Vanetta Mulders, MD ?09/22/21 [phone] ? ?  ?Vanetta Mulders, MD ?09/22/21 1047 ? ?

## 2021-12-09 ENCOUNTER — Other Ambulatory Visit (HOSPITAL_COMMUNITY): Payer: Self-pay | Admitting: Orthopedic Surgery

## 2021-12-11 ENCOUNTER — Encounter (HOSPITAL_BASED_OUTPATIENT_CLINIC_OR_DEPARTMENT_OTHER): Payer: Self-pay | Admitting: Orthopedic Surgery

## 2021-12-11 ENCOUNTER — Other Ambulatory Visit: Payer: Self-pay

## 2021-12-17 ENCOUNTER — Ambulatory Visit (HOSPITAL_BASED_OUTPATIENT_CLINIC_OR_DEPARTMENT_OTHER): Payer: BC Managed Care – PPO | Admitting: Anesthesiology

## 2021-12-17 ENCOUNTER — Encounter (HOSPITAL_BASED_OUTPATIENT_CLINIC_OR_DEPARTMENT_OTHER)
Admission: RE | Disposition: A | Discharge status: Home / Self Care | Payer: Self-pay | Source: Home / Self Care | Attending: Orthopedic Surgery

## 2021-12-17 ENCOUNTER — Ambulatory Visit (HOSPITAL_BASED_OUTPATIENT_CLINIC_OR_DEPARTMENT_OTHER): Payer: BC Managed Care – PPO

## 2021-12-17 ENCOUNTER — Other Ambulatory Visit: Payer: Self-pay

## 2021-12-17 ENCOUNTER — Ambulatory Visit (HOSPITAL_BASED_OUTPATIENT_CLINIC_OR_DEPARTMENT_OTHER)
Admission: RE | Admit: 2021-12-17 | Discharge: 2021-12-17 | Disposition: A | Discharge status: Home / Self Care | Payer: BC Managed Care – PPO | Attending: Orthopedic Surgery | Admitting: Orthopedic Surgery

## 2021-12-17 ENCOUNTER — Encounter (HOSPITAL_BASED_OUTPATIENT_CLINIC_OR_DEPARTMENT_OTHER): Payer: Self-pay | Admitting: Orthopedic Surgery

## 2021-12-17 DIAGNOSIS — I10 Essential (primary) hypertension: Secondary | ICD-10-CM | POA: Insufficient documentation

## 2021-12-17 DIAGNOSIS — M79671 Pain in right foot: Secondary | ICD-10-CM | POA: Diagnosis present

## 2021-12-17 DIAGNOSIS — L02611 Cutaneous abscess of right foot: Secondary | ICD-10-CM | POA: Insufficient documentation

## 2021-12-17 HISTORY — PX: INCISION AND DRAINAGE OF WOUND: SHX1803

## 2021-12-17 SURGERY — IRRIGATION AND DEBRIDEMENT WOUND
Anesthesia: General | Laterality: Right

## 2021-12-17 MED ORDER — VANCOMYCIN HCL 500 MG IV SOLR
INTRAVENOUS | Status: DC | PRN
  Administered 2021-12-17: 500 mg

## 2021-12-17 MED ORDER — MIDAZOLAM HCL 2 MG/2ML IJ SOLN
INTRAMUSCULAR | Status: AC
  Filled 2021-12-17: qty 2

## 2021-12-17 MED ORDER — FENTANYL CITRATE (PF) 100 MCG/2ML IJ SOLN
INTRAMUSCULAR | Status: DC | PRN
Start: 2021-12-17 — End: 2021-12-17
  Administered 2021-12-17 (×2): 50 ug via INTRAVENOUS

## 2021-12-17 MED ORDER — 0.9 % SODIUM CHLORIDE (POUR BTL) OPTIME
TOPICAL | Status: DC | PRN
  Administered 2021-12-17: 3000 mL

## 2021-12-17 MED ORDER — OXYCODONE HCL 5 MG/5ML PO SOLN: 5.0000 mg | Freq: Once | ORAL | Status: DC | PRN

## 2021-12-17 MED ORDER — ACETAMINOPHEN 160 MG/5ML PO SOLN: 325.0000 mg | ORAL | Status: DC | PRN

## 2021-12-17 MED ORDER — OXYCODONE HCL 5 MG PO TABS: 5.0000 mg | ORAL_TABLET | Freq: Once | ORAL | Status: DC | PRN

## 2021-12-17 MED ORDER — DEXAMETHASONE SODIUM PHOSPHATE 4 MG/ML IJ SOLN
INTRAMUSCULAR | Status: DC | PRN
  Administered 2021-12-17: 4 mg via INTRAVENOUS

## 2021-12-17 MED ORDER — ACETAMINOPHEN 325 MG PO TABS: 325.0000 mg | ORAL_TABLET | ORAL | Status: DC | PRN

## 2021-12-17 MED ORDER — BUPIVACAINE-EPINEPHRINE 0.5% -1:200000 IJ SOLN
INTRAMUSCULAR | Status: DC | PRN
  Administered 2021-12-17: 20 mL

## 2021-12-17 MED ORDER — SODIUM CHLORIDE 0.9 % IV SOLN: INTRAVENOUS | Status: DC

## 2021-12-17 MED ORDER — FENTANYL CITRATE (PF) 100 MCG/2ML IJ SOLN: 25.0000 ug | INTRAMUSCULAR | Status: DC | PRN

## 2021-12-17 MED ORDER — LACTATED RINGERS IV SOLN: INTRAVENOUS | Status: DC

## 2021-12-17 MED ORDER — MIDAZOLAM HCL 5 MG/5ML IJ SOLN
INTRAMUSCULAR | Status: DC | PRN
  Administered 2021-12-17: 2 mg via INTRAVENOUS

## 2021-12-17 MED ORDER — FENTANYL CITRATE (PF) 100 MCG/2ML IJ SOLN
INTRAMUSCULAR | Status: AC
  Filled 2021-12-17: qty 2

## 2021-12-17 MED ORDER — LIDOCAINE HCL (CARDIAC) PF 100 MG/5ML IV SOSY
PREFILLED_SYRINGE | INTRAVENOUS | Status: DC | PRN
  Administered 2021-12-17: 60 mg via INTRAVENOUS

## 2021-12-17 MED ORDER — CEFAZOLIN SODIUM-DEXTROSE 2-4 GM/100ML-% IV SOLN
INTRAVENOUS | Status: AC
  Filled 2021-12-17: qty 100

## 2021-12-17 MED ORDER — ONDANSETRON HCL 4 MG/2ML IJ SOLN
INTRAMUSCULAR | Status: AC
  Filled 2021-12-17: qty 2

## 2021-12-17 MED ORDER — MEPERIDINE HCL 25 MG/ML IJ SOLN: 6.2500 mg | INTRAMUSCULAR | Status: DC | PRN

## 2021-12-17 MED ORDER — CEFAZOLIN SODIUM-DEXTROSE 2-3 GM-%(50ML) IV SOLR
INTRAVENOUS | Status: DC | PRN
  Administered 2021-12-17: 2 g via INTRAVENOUS

## 2021-12-17 MED ORDER — PROPOFOL 10 MG/ML IV BOLUS
INTRAVENOUS | Status: DC | PRN
  Administered 2021-12-17: 150 mg via INTRAVENOUS

## 2021-12-17 MED ORDER — EPHEDRINE 5 MG/ML INJ
INTRAVENOUS | Status: AC
  Filled 2021-12-17: qty 5

## 2021-12-17 MED ORDER — ONDANSETRON HCL 4 MG/2ML IJ SOLN: 4.0000 mg | Freq: Once | INTRAMUSCULAR | Status: DC | PRN

## 2021-12-17 MED ORDER — HYDROCODONE-ACETAMINOPHEN 5-325 MG PO TABS: 1.0000 | ORAL_TABLET | Freq: Four times a day (QID) | ORAL | 0 refills | Status: AC | PRN

## 2021-12-17 MED ORDER — ONDANSETRON HCL 4 MG/2ML IJ SOLN
INTRAMUSCULAR | Status: DC | PRN
  Administered 2021-12-17: 4 mg via INTRAVENOUS

## 2021-12-17 SURGICAL SUPPLY — 64 items
APL PRP STRL LF DISP 70% ISPRP (MISCELLANEOUS)
BANDAGE ESMARK 6X9 LF (GAUZE/BANDAGES/DRESSINGS) IMPLANT
BLADE SURG 15 STRL LF DISP TIS (BLADE) ×4 IMPLANT
BLADE SURG 15 STRL SS (BLADE) ×4
BNDG CMPR 5X3 CHSV STRCH STRL (GAUZE/BANDAGES/DRESSINGS)
BNDG CMPR 9X4 STRL LF SNTH (GAUZE/BANDAGES/DRESSINGS)
BNDG CMPR 9X6 STRL LF SNTH (GAUZE/BANDAGES/DRESSINGS)
BNDG COHESIVE 3X5 TAN ST LF (GAUZE/BANDAGES/DRESSINGS) IMPLANT
BNDG ELASTIC 4X5.8 VLCR STR LF (GAUZE/BANDAGES/DRESSINGS) IMPLANT
BNDG ELASTIC 6X5.8 VLCR STR LF (GAUZE/BANDAGES/DRESSINGS) IMPLANT
BNDG ESMARK 4X9 LF (GAUZE/BANDAGES/DRESSINGS) IMPLANT
BNDG ESMARK 6X9 LF (GAUZE/BANDAGES/DRESSINGS)
BNDG GAUZE DERMACEA FLUFF (GAUZE/BANDAGES/DRESSINGS)
BNDG GAUZE DERMACEA FLUFF 4 (GAUZE/BANDAGES/DRESSINGS) IMPLANT
BNDG GZE DERMACEA 4 6PLY (GAUZE/BANDAGES/DRESSINGS)
CANISTER SUCT 1200ML W/VALVE (MISCELLANEOUS) ×3 IMPLANT
CHLORAPREP W/TINT 26 (MISCELLANEOUS) IMPLANT
COVER BACK TABLE 60X90IN (DRAPES) ×3 IMPLANT
CUFF TOURN SGL QUICK 24 (TOURNIQUET CUFF)
CUFF TOURN SGL QUICK 34 (TOURNIQUET CUFF)
CUFF TRNQT CYL 24X4X16.5-23 (TOURNIQUET CUFF) IMPLANT
CUFF TRNQT CYL 34X4.125X (TOURNIQUET CUFF) IMPLANT
DRAPE EXTREMITY T 121X128X90 (DISPOSABLE) ×3 IMPLANT
DRAPE INCISE IOBAN 66X45 STRL (DRAPES) IMPLANT
DRAPE SURG 17X23 STRL (DRAPES) IMPLANT
DRAPE U-SHAPE 47X51 STRL (DRAPES) ×3 IMPLANT
DRSG MEPITEL 4X7.2 (GAUZE/BANDAGES/DRESSINGS) ×3 IMPLANT
DRSG PAD ABDOMINAL 8X10 ST (GAUZE/BANDAGES/DRESSINGS) IMPLANT
ELECT REM PT RETURN 9FT ADLT (ELECTROSURGICAL) ×2
ELECTRODE REM PT RTRN 9FT ADLT (ELECTROSURGICAL) ×2 IMPLANT
GLOVE BIO SURGEON STRL SZ8 (GLOVE) ×3 IMPLANT
GLOVE BIOGEL PI IND STRL 8 (GLOVE) ×4 IMPLANT
GLOVE BIOGEL PI INDICATOR 8 (GLOVE) ×2
GLOVE ECLIPSE 8.0 STRL XLNG CF (GLOVE) ×3 IMPLANT
GOWN STRL REUS W/ TWL LRG LVL3 (GOWN DISPOSABLE) ×2 IMPLANT
GOWN STRL REUS W/ TWL XL LVL3 (GOWN DISPOSABLE) ×4 IMPLANT
GOWN STRL REUS W/TWL LRG LVL3 (GOWN DISPOSABLE) ×2
GOWN STRL REUS W/TWL XL LVL3 (GOWN DISPOSABLE) ×4
MANIFOLD NEPTUNE II (INSTRUMENTS) ×3 IMPLANT
NEEDLE HYPO 22GX1.5 SAFETY (NEEDLE) IMPLANT
PACK BASIN DAY SURGERY FS (CUSTOM PROCEDURE TRAY) ×3 IMPLANT
PAD CAST 4YDX4 CTTN HI CHSV (CAST SUPPLIES) ×4 IMPLANT
PADDING CAST COTTON 4X4 STRL (CAST SUPPLIES) ×4
PENCIL SMOKE EVACUATOR (MISCELLANEOUS) ×3 IMPLANT
SANITIZER HAND PURELL 535ML FO (MISCELLANEOUS) IMPLANT
SET IRRIG Y TYPE TUR BLADDER L (SET/KITS/TRAYS/PACK) ×3 IMPLANT
SHEET MEDIUM DRAPE 40X70 STRL (DRAPES) ×3 IMPLANT
SPLINT FAST PLASTER 5X30 (CAST SUPPLIES)
SPLINT PLASTER CAST FAST 5X30 (CAST SUPPLIES) IMPLANT
SPONGE T-LAP 18X18 ~~LOC~~+RFID (SPONGE) ×3 IMPLANT
STOCKINETTE 6  STRL (DRAPES) ×2
STOCKINETTE 6 STRL (DRAPES) ×2 IMPLANT
SUCTION FRAZIER HANDLE 10FR (MISCELLANEOUS)
SUCTION TUBE FRAZIER 10FR DISP (MISCELLANEOUS) IMPLANT
SUT ETHILON 3 0 PS 1 (SUTURE) IMPLANT
SUT VIC AB 2-0 SH 27 (SUTURE)
SUT VIC AB 2-0 SH 27XBRD (SUTURE) IMPLANT
SYR BULB EAR ULCER 3OZ GRN STR (SYRINGE) ×3 IMPLANT
SYR CONTROL 10ML LL (SYRINGE) IMPLANT
TOWEL GREEN STERILE FF (TOWEL DISPOSABLE) ×3 IMPLANT
TRAY DSU PREP LF (CUSTOM PROCEDURE TRAY) IMPLANT
TUBE CONNECTING 20X1/4 (TUBING) ×3 IMPLANT
UNDERPAD 30X36 HEAVY ABSORB (UNDERPADS AND DIAPERS) ×3 IMPLANT
YANKAUER SUCT BULB TIP NO VENT (SUCTIONS) ×3 IMPLANT

## 2021-12-17 NOTE — Anesthesia Postprocedure Evaluation (Signed)
Anesthesia Post Note  Patient: Brianna Calderon  Procedure(s) Performed: irrigation and debridement right foot abscess (Right)     Patient location during evaluation: PACU Anesthesia Type: General Level of consciousness: awake and alert Pain management: pain level controlled Vital Signs Assessment: post-procedure vital signs reviewed and stable Respiratory status: spontaneous breathing, nonlabored ventilation and respiratory function stable Cardiovascular status: stable and blood pressure returned to baseline Anesthetic complications: no   No notable events documented.  Last Vitals:  Vitals:   12/17/21 1430 12/17/21 1437  BP: (!) 152/81   Pulse: 69 66  Resp: (!) 22 (!) 21  Temp:    SpO2: 100% 100%    Last Pain:  Vitals:   12/17/21 1417  TempSrc:   PainSc: 6                  Beryle Lathe

## 2021-12-17 NOTE — Discharge Instructions (Addendum)
Toni Arthurs, MD [facility]  Please read the following information regarding your care after surgery.  Medications  You only need a prescription for the narcotic pain medicine (ex. oxycodone, Percocet, Norco).  All of the other medicines listed below are available over the counter. X Aleve 2 pills twice a day for the first 3 days after surgery. X hydrocodone as prescribed for severe pain  Weight Bearing X Bear weight only on your operated foot in the post-op shoe.  Cast / Splint / Dressing X  Change the packing in the wound daily starting the day after surgery as follows:  remove the old piece of packing, shower with soap and water, place a new piece of packing down in the wound with a q-tip or tweezers.  Swelling It is normal for you to have swelling where you had surgery.  To reduce swelling and pain, keep your toes above your nose for at least 3 days after surgery.  It may be necessary to keep your foot or leg elevated for several weeks.  If it hurts, it should be elevated.  Follow Up Call my office at [phone] when you are discharged from the hospital or surgery center to schedule an appointment to be seen two weeks after surgery.  Call my office at [phone] if you develop a fever >101.5 F, nausea, vomiting, bleeding from the surgical site or severe pain.       Post Anesthesia Home Care Instructions  Activity: Get plenty of rest for the remainder of the day. A responsible individual must stay with you for 24 hours following the procedure.  For the next 24 hours, DO NOT: -Drive a car -Advertising copywriter -Drink alcoholic beverages -Take any medication unless instructed by your physician -Make any legal decisions or sign important papers.  Meals: Start with liquid foods such as gelatin or soup. Progress to regular foods as tolerated. Avoid greasy, spicy, heavy foods. If nausea and/or vomiting occur, drink only clear liquids until the nausea and/or vomiting subsides.  Call your physician if vomiting continues.  Special Instructions/Symptoms: Your throat may feel dry or sore from the anesthesia or the breathing tube placed in your throat during surgery. If this causes discomfort, gargle with warm salt water. The discomfort should disappear within 24 hours.  If you had a scopolamine patch placed behind your ear for the management of post- operative nausea and/or vomiting:  1. The medication in the patch is effective for 72 hours, after which it should be removed.  Wrap patch in a tissue and discard in the trash. Wash hands thoroughly with soap and water. 2. You may remove the patch earlier than 72 hours if you experience unpleasant side effects which may include dry mouth, dizziness or visual disturbances. 3. Avoid touching the patch. Wash your hands with soap and water after contact with the patch.      Call your surgeon if you experience:   1.  Fever over 101.0. 2.  Inability to urinate. 3.  Nausea and/or vomiting. 4.  Extreme swelling or bruising at the surgical site. 5.  Continued bleeding from the incision. 6.  Increased pain, redness or drainage from the incision. 7.  Problems related to your pain medication. 8.  Any problems and/or concerns

## 2021-12-17 NOTE — H&P (Signed)
Brianna Calderon is an 61 y.o. female.   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  MRI shows phlegmon / abscess over the dorsal 2nd and 3rd MTPJ.  She is off abx for the last week.  Past Medical History:  Diagnosis Date   Arthritis    Hypertension     Past Surgical History:  Procedure Laterality Date   ANKLE ARTHROSCOPY Right 07/14/2017   Procedure: Ankle Arthroscopy  with Extensive Debridement;  Surgeon: Toni Arthurs, MD;  Location: [facility];  Service: Orthopedics;  Laterality: Right;   SHOULDER CAPSULORRHAPHY     WRIST FLEXION TENDON TENOTOMIES AND PROXIMAL CORPECTOMY W/ WRIST ARTHRODESIS&ILIAC CREST BONE GRAFT      History reviewed. No pertinent family history. Social History:  reports that she has been smoking cigarettes. She has been smoking an average of .5 packs per day. She has never used smokeless tobacco. She reports current alcohol use. She reports that she does not use drugs.  Allergies: No Known Allergies  Medications Prior to Admission  Medication Sig Dispense Refill   oxyCODONE (ROXICODONE) 5 MG immediate release tablet Take 1 tablet (5 mg total) by mouth every 4 (four) hours as needed for moderate pain or severe pain. For no more than 5 days. 30 tablet 0   telmisartan (MICARDIS) 40 MG tablet Take 40 mg by mouth daily.     traMADol (ULTRAM) 50 MG tablet Take 50 mg by mouth every 6 (six) hours as needed.      No results found for this or any previous visit (from the past 48 hour(s)). DG MINI C-ARM IMAGE ONLY  Result Date: 01-02-22 There is no interpretation for this exam.  This order is for images obtained during a surgical procedure.  Please See "Surgeries" Tab for more information regarding the procedure.    Review of Systems  no recent f/c/n/v/wt loss  Blood pressure (!) 149/78, pulse 74, temperature 98.6 F  (37 C), temperature source Oral, resp. rate 18, height 5\' 6"  (1.676 m), weight 63 kg, SpO2 99 %. Physical Exam  Right forefoot with small eschar at the dorsal 2nd webspace.  Slight swelling at the dorsal subcutaeous tissue.  No erythema today.  TTP at the 2nd webspace.  Assessment/Plan R dorsal foot abscess.to the OR today for I and D of the abscess and cultures off abx.  The risks and benefits of the alternative treatment options have been discussed in detail.  The patient wishes to proceed with surgery and specifically understands risks of bleeding, infection, nerve damage, blood clots, need for additional surgery, amputation and death.   , MD 02-Jan-2022, 12:57 PM

## 2021-12-17 NOTE — Op Note (Addendum)
12/17/2021  1:55 PM  PATIENT:  Brianna Calderon  61 y.o. female  PRE-OPERATIVE DIAGNOSIS: right foot abscess  POST-OPERATIVE DIAGNOSIS:  same  Procedure(s): Irrigation and excisional debridement of right foot abscess deep to the fascia including 2nd webspace, 2nd and 3rd MTP joint dorsal joint capsules  SURGEON:  Toni Arthurs, MD  ASSISTANT: none  ANESTHESIA:   General, local  EBL:  minimal   TOURNIQUET:  < 30 min with ankle esmarch  COMPLICATIONS:  None apparent  DISPOSITION:  Extubated, awake and stable to recovery.  INDICATION FOR PROCEDURE: 61 year old female with a past medical history significant for multiply operated right foot.  She developed a subcutaneous abscess at the second webspace more than a year removed from previous forefoot surgery.  She presents now for irrigation and debridement of this abscess.  The risks and benefits of the alternative treatment options have been discussed in detail.  The patient wishes to proceed with surgery and specifically understands risks of bleeding, infection, nerve damage, blood clots, need for additional surgery, amputation and death.    PROCEDURE IN DETAIL: After preoperative consent was obtained and the correct operative site was identified, the patient was brought the operating room and placed upon the operating table.  General anesthesia was induced.  Preoperative antibiotics were held pending intraoperative cultures.  A surgical timeout was taken.  The right lower extremity was prepped and draped in standard sterile fashion.  The foot was exsanguinated and a 4 inch Esmarch tourniquet wrapped around the ankle.  The previous second webspace incision was identified.  It was opened again sharply and dissection carried down into the webspace.  Immediately evident was a scant amount of purulent fluid extending dorsal to the second and third MTP joints.  Specimens were obtained for aerobic and anaerobic culture.  Excisional debridement was then  performed dorsal to the second and third MTP joints and into the second webspace.  All nonviable tissue was sharply excised with a scalpel, curette and rondure.  Wound was then irrigated copiously with 3 L of normal saline.  Perioperative antibiotics were administered.  The tourniquet was released, and hemostasis was achieved.  The wound was sprinkled with vancomycin powder.  The proximal half of the wound was closed with a horizontal mattress suture of 3-0 nylon.  The distal portion of the wound then was used to pack with 1/2 inch iodoform gauze.  Sterile dressings were applied followed by compression wrap.  The patient was awakened from anesthesia and transported to the recovery room in stable condition.   FOLLOW UP PLAN: Weightbearing as tolerated in a flat postop shoe.  Change the packing daily.  Follow-up in the office in 2 weeks for suture removal and wound check.  No indication for DVT prophylaxis in this ambulatory patient.  Debridement type: Excisional Debridement  Side: right  Body Location: foot   Tools used for debridement: scalpel, scissors, curette, and rongeur  Pre-debridement Wound size (cm):   Length: 2 mm        Width: 2 mm     Depth: 10 mm   Post-debridement Wound size (cm):   Length: 3 cm        Width: 1 cm     Depth: 2 cm   Debridement depth beyond dead/damaged tissue down to healthy viable tissue: yes  Tissue layer involved: skin, skin, subcutaneous tissue, and skin, subcutaneous tissue, muscle / fascia  Nature of tissue removed: Necrotic, Devitalized Tissue, and Purulence  Irrigation volume: 3 L  Irrigation fluid type: Normal Saline

## 2021-12-17 NOTE — Transfer of Care (Signed)
Immediate Anesthesia Transfer of Care Note  Patient: Brianna Calderon  Procedure(s) Performed: irrigation and debridement right foot abscess (Right)  Patient Location: PACU  Anesthesia Type:General  Level of Consciousness: sedated  Airway & Oxygen Therapy: Patient Spontanous Breathing and Patient connected to face mask oxygen  Post-op Assessment: Report given to RN and Post -op Vital signs reviewed and stable  Post vital signs: Reviewed and stable  Last Vitals:  Vitals Value Taken Time  BP 107/64 12/17/21 1351  Temp    Pulse 67 12/17/21 1353  Resp 13 12/17/21 1353  SpO2 98 % 12/17/21 1353  Vitals shown include unvalidated device data.  Last Pain:  Vitals:   12/17/21 1158  TempSrc: Oral  PainSc: 4       Patients Stated Pain Goal: 4 (12/17/21 1158)  Complications: No notable events documented.

## 2021-12-17 NOTE — Anesthesia Procedure Notes (Signed)
Procedure Name: LMA Insertion Date/Time: 12/17/2021 1:29 PM  Performed by: Lance Coon, CRNAPre-anesthesia Checklist: Patient identified, Emergency Drugs available, Suction available and Patient being monitored Patient Re-evaluated:Patient Re-evaluated prior to induction Oxygen Delivery Method: Circle system utilized Preoxygenation: Pre-oxygenation with 100% oxygen Induction Type: IV induction Ventilation: Mask ventilation without difficulty LMA: LMA inserted LMA Size: 4.0 Number of attempts: 1 Airway Equipment and Method: Bite block Placement Confirmation: positive ETCO2 Tube secured with: Tape Dental Injury: Teeth and Oropharynx as per pre-operative assessment

## 2021-12-17 NOTE — Anesthesia Preprocedure Evaluation (Addendum)
Anesthesia Evaluation  Patient identified by MRN, date of birth, ID band Patient awake    Reviewed: Allergy & Precautions, NPO status , Patient's Chart, lab work & pertinent test results  Airway Mallampati: II  TM Distance: >3 FB Neck ROM: Full    Dental  (+) Dental Advisory Given, Chipped   Pulmonary Current SmokerPatient did not abstain from smoking.,    Pulmonary exam normal        Cardiovascular hypertension, Pt. on medications Normal cardiovascular exam     Neuro/Psych negative neurological ROS  negative psych ROS   GI/Hepatic negative GI ROS, Neg liver ROS,   Endo/Other  negative endocrine ROS  Renal/GU negative Renal ROS     Musculoskeletal  (+) Arthritis ,   Abdominal   Peds  Hematology negative hematology ROS (+)   Anesthesia Other Findings   Reproductive/Obstetrics                            Anesthesia Physical  Anesthesia Plan  ASA: 2  Anesthesia Plan: General   Post-op Pain Management: Tylenol PO (pre-op)*   Induction: Intravenous  PONV Risk Score and Plan: 2 and Midazolam, Dexamethasone, Ondansetron and Treatment may vary due to age or medical condition  Airway Management Planned: LMA  Additional Equipment: None  Intra-op Plan:   Post-operative Plan: Extubation in OR  Informed Consent: I have reviewed the patients History and Physical, chart, labs and discussed the procedure including the risks, benefits and alternatives for the proposed anesthesia with the patient or authorized representative who has indicated his/her understanding and acceptance.     Dental advisory given  Plan Discussed with: CRNA and Anesthesiologist  Anesthesia Plan Comments:        Anesthesia Quick Evaluation

## 2021-12-18 ENCOUNTER — Encounter (HOSPITAL_BASED_OUTPATIENT_CLINIC_OR_DEPARTMENT_OTHER): Payer: Self-pay | Admitting: Orthopedic Surgery

## 2021-12-22 LAB — AEROBIC/ANAEROBIC CULTURE W GRAM STAIN (SURGICAL/DEEP WOUND)
Culture: NORMAL
Gram Stain: NONE SEEN

## 2023-08-06 IMAGING — DX DG CHEST 1V PORT
1 series · 1 of 1 positions shown · non-contrast
Comparison: None.

CLINICAL DATA: Chest pain with burning and pressure since 4 a.m.

EXAM:
PORTABLE CHEST 1 VIEW

[chest ap]
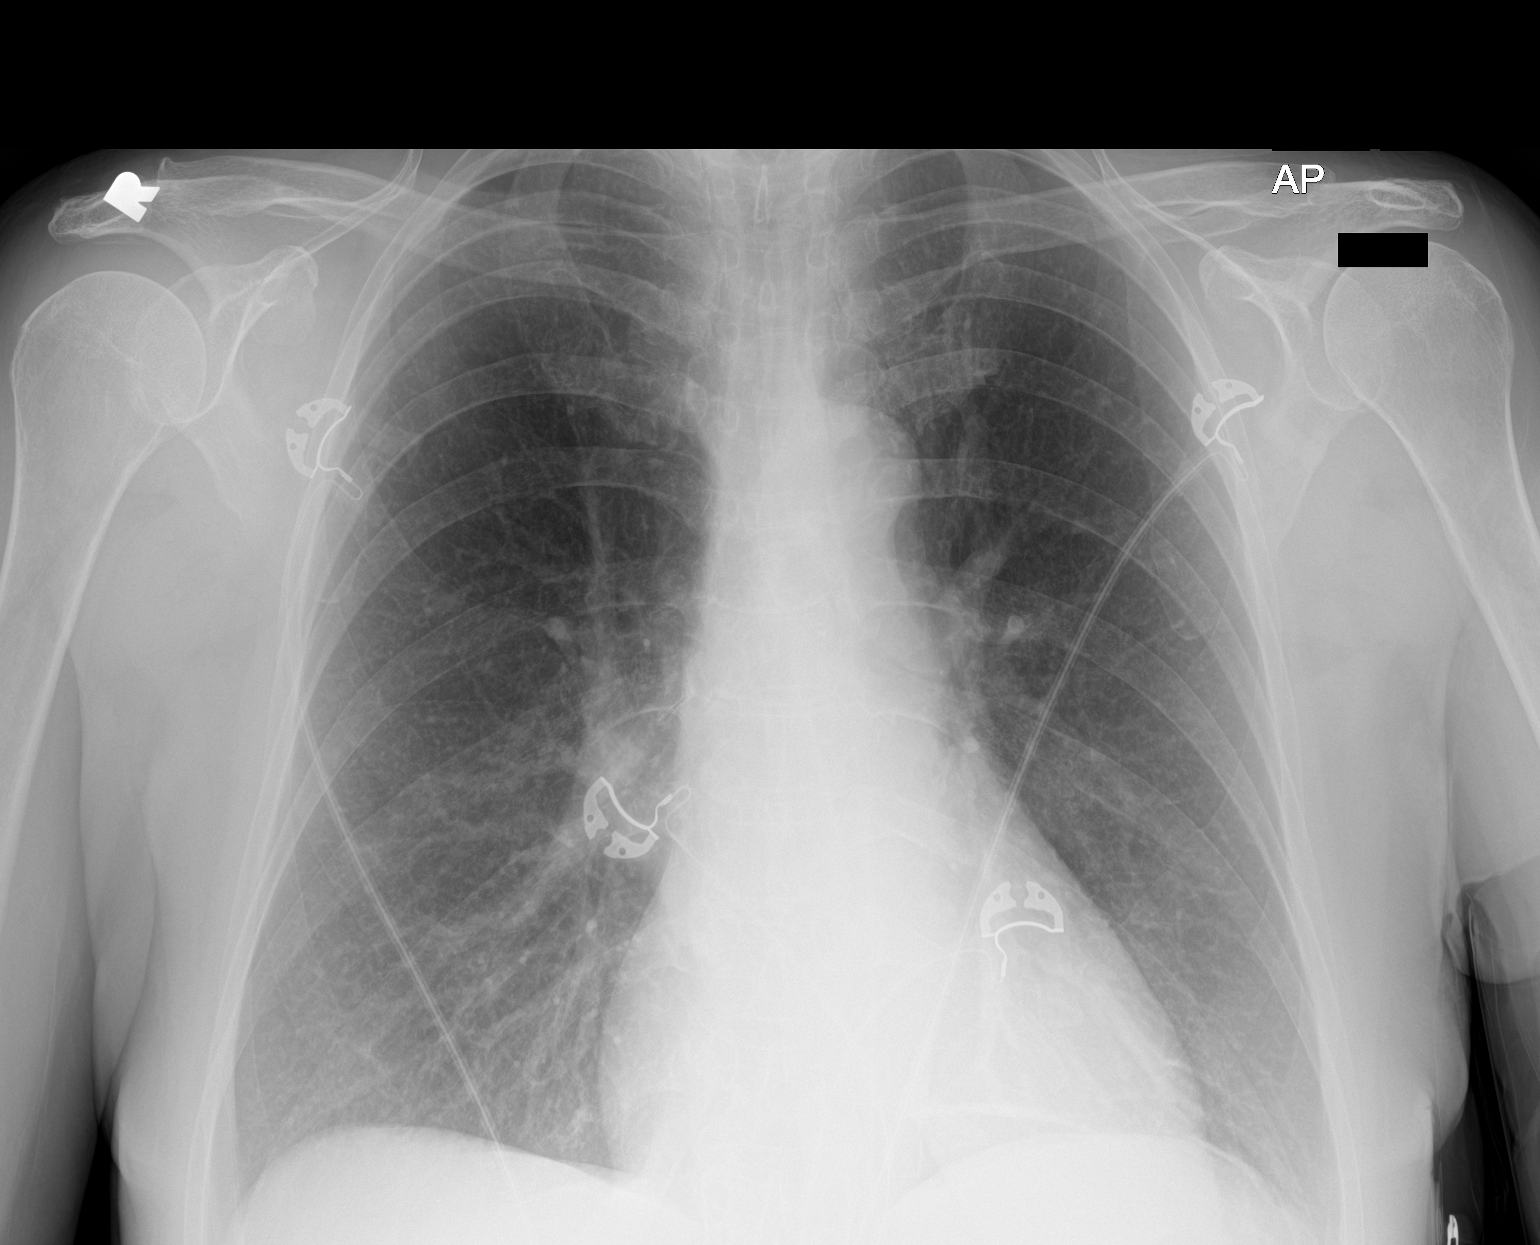

[1 of 1 positions shown; findings below may reference images not displayed]

FINDINGS: Normal heart size and mediastinal contours. No acute infiltrate or
edema. No effusion or pneumothorax. No acute osseous findings.
IMPRESSION: No active disease.

## 2024-02-10 ENCOUNTER — Other Ambulatory Visit: Payer: Self-pay

## 2024-02-10 ENCOUNTER — Ambulatory Visit
Admission: EM | Admit: 2024-02-10 | Discharge: 2024-02-10 | Disposition: A | Discharge status: Home / Self Care | Attending: Physician Assistant | Admitting: Physician Assistant

## 2024-02-10 DIAGNOSIS — R509 Fever, unspecified: Secondary | ICD-10-CM | POA: Diagnosis not present

## 2024-02-10 DIAGNOSIS — I10 Essential (primary) hypertension: Secondary | ICD-10-CM | POA: Diagnosis present

## 2024-02-10 DIAGNOSIS — Z87448 Personal history of other diseases of urinary system: Secondary | ICD-10-CM | POA: Diagnosis not present

## 2024-02-10 DIAGNOSIS — R0789 Other chest pain: Secondary | ICD-10-CM | POA: Diagnosis not present

## 2024-02-10 DIAGNOSIS — J029 Acute pharyngitis, unspecified: Secondary | ICD-10-CM | POA: Insufficient documentation

## 2024-02-10 DIAGNOSIS — R300 Dysuria: Secondary | ICD-10-CM | POA: Diagnosis present

## 2024-02-10 DIAGNOSIS — R350 Frequency of micturition: Secondary | ICD-10-CM | POA: Insufficient documentation

## 2024-02-10 DIAGNOSIS — Z79899 Other long term (current) drug therapy: Secondary | ICD-10-CM | POA: Diagnosis not present

## 2024-02-10 DIAGNOSIS — R232 Flushing: Secondary | ICD-10-CM | POA: Insufficient documentation

## 2024-02-10 DIAGNOSIS — F17210 Nicotine dependence, cigarettes, uncomplicated: Secondary | ICD-10-CM | POA: Diagnosis not present

## 2024-02-10 LAB — POCT URINE DIPSTICK
Bilirubin, UA: NEGATIVE
Glucose, UA: NEGATIVE mg/dL
Ketones, POC UA: NEGATIVE mg/dL
Nitrite, UA: POSITIVE — AB
Spec Grav, UA: 1.015 (ref 1.010–1.025)
Urobilinogen, UA: 0.2 U/dL
pH, UA: 6 (ref 5.0–8.0)

## 2024-02-10 LAB — POC COVID19/FLU A&B COMBO
Covid Antigen, POC: NEGATIVE
Influenza A Antigen, POC: NEGATIVE
Influenza B Antigen, POC: NEGATIVE

## 2024-02-10 LAB — POCT RAPID STREP A (OFFICE): Rapid Strep A Screen: NEGATIVE

## 2024-02-10 MED ORDER — CEPHALEXIN 500 MG PO CAPS: 500.0000 mg | ORAL_CAPSULE | Freq: Two times a day (BID) | ORAL | 0 refills | Status: AC

## 2024-02-10 MED ORDER — HYDRALAZINE HCL 10 MG PO TABS: 10.0000 mg | ORAL_TABLET | Freq: Three times a day (TID) | ORAL | 0 refills | Status: AC

## 2024-02-10 NOTE — ED Notes (Signed)
 Erin PA made aware of pt's blood pressure.

## 2024-02-10 NOTE — Discharge Instructions (Addendum)
 Based on your symptoms and results of the urinalysis I believe you have a UTI I recommend the following:  I have sent in a script for keflex  500 mg to be taken by mouth twice per day for 5 days  Please finish the entire course of the antibiotic even if you are feeling better before it is completed unless you develop an allergic reaction or are told by a medical provider to stop taking it. We have sent a sample of your urine off for a urine culture.  This will help us  determine what bacteria is causing your symptoms as well as the most appropriate antibiotic to treat it.  If we need to make any adjustments to your medication regimen and new medication will be sent to the pharmacy on file and you will be updated via phone call in MyChart. Stay well hydrated (at least 75 oz of water per day) and avoid holding your urine for prolonged periods of time. If you have any of the following please return to urgent care or go to the emergency room: Persistent symptoms, fever, trouble urinating or inability to urinate, confusion, flank pain.   Based on your described symptoms and the duration of symptoms it is likely that you have a viral upper respiratory infection (often called a cold)  Symptoms can last for 3-10 days with lingering cough and intermittent symptoms lasting weeks after that.  The goal of treatment at this time is to reduce your symptoms and discomfort   I recommend using Robitussin and Mucinex (regular formulations, nothing with decongestants or DM)  You can also use Tylenol  for body aches and fever reduction I also recommend adding an antihistamine to your daily regimen This includes medications like Claritin, Allegra, Zyrtec- the generics of these work very well and are usually less expensive I recommend using Flonase nasal spray - 2 puffs twice per day to help with your nasal congestion The antihistamines and Flonase can take a few weeks to provide significant relief from allergy symptoms  but should start to provide some benefit soon. You can use a humidifier at night to help with preventing nasal dryness and irritation   If your symptoms are not improving or seem to be getting worse over the next 5 to 7 days you can always return here to urgent care or you can follow-up with your primary care provider for ongoing management Go to the ER if you begin to have more serious symptoms such as shortness of breath, trouble breathing, loss of consciousness, swelling around the eyes, high fever, severe lasting headaches, vision changes or neck pain/stiffness.   Your blood pressure was rather high today.  I am sending in a medication called hydralazine  for you to take as needed up to 3 times per day.  Please check your blood pressure at home and if you notice that it is over 150 systolic and over 95 diastolic please take hydralazine . If your blood pressure gets over 180 systolic and over 105 diastolic and you develop any of the following symptoms please go to the emergency room: Headache, vision changes, chest pain, shortness of breath or trouble breathing, difficulty urinating or blood in the urine

## 2024-02-10 NOTE — ED Triage Notes (Addendum)
 Pt presents to urgent care following right foot surgery on Tuesday 8/19. Pt states she is concerned because after surgery she began to have low grade fevers, skin that is hot to the touch (from the waist up), and burning with urination. Sore throat also reported but believes this is due to the anesthesia. Currently rates overall pain a 5/10. OTC Asprin + prescribed tramadol taken at home with improvement. Called surgeon this morning, stated to come to UC.

## 2024-02-10 NOTE — ED Provider Notes (Signed)
 [facility]    CSN: [id number] Arrival date & time: 02/10/24  1247      History   Chief Complaint Chief Complaint  Patient presents with   Fever   Post-op Problem    HPI Brianna Calderon is a 63 y.o. female.   HPI  Pt presents today with concerns for potential upper respiratory infection, UTI and states she just had surgery on her right foot She states she is not concerned for her surgical site - she denies redness, swelling, drainage or bleeding from the site  She reports sore throat, chest tightness, hot flashes and flushed skin  She reports a low grade fever- states this AM it was 99.7  She admits to decreased appetite and sore throat as well. Denies N/V/D or abdominal pain    She also reports mild burning with urination. She admits to a previous hx of bladder inflammation and is not sure if current symptoms are from a flare from this vs UTI   Past Medical History:  Diagnosis Date   Arthritis    Hypertension     There are no active problems to display for this patient.   Past Surgical History:  Procedure Laterality Date   ANKLE ARTHROSCOPY Right 07/14/2017   Procedure: Ankle Arthroscopy  with Extensive Debridement;  Surgeon: Kit Rush, MD;  Location: [facility];  Service: Orthopedics;  Laterality: Right;   INCISION AND DRAINAGE OF WOUND Right 12/17/2021   Procedure: irrigation and debridement right foot abscess;  Surgeon: Kit Rush, MD;  Location:  SUR[facility]ERY C[facility];  Service: Orthopedics;  Laterality: Right;  local by surgeon   SHOULDER CAPSULORRHAPHY     WRIST FLEXION TENDON TENOTOMIES AND PROXIMAL CORPECTOMY W/ WRIST ARTHRODESIS&ILIAC CREST BONE GRAFT      OB History   No obstetric history on file.      Home Medications    Prior to Admission medications   Medication Sig Start Date End Date Taking? Authorizing Provider  aspirin 325 MG tablet Take 325 mg by mouth.   Yes [provider]  cephALEXin  (KEFLEX )  500 MG capsule Take 1 capsule (500 mg total) by mouth 2 (two) times daily for 5 days. 02/10/24 02/15/24 Yes Ares Cardozo E, PA-C  hydrALAZINE  (APRESOLINE ) 10 MG tablet Take 1 tablet (10 mg total) by mouth 3 (three) times daily. 02/10/24  Yes Candi Profit E, PA-C  telmisartan (MICARDIS) 40 MG tablet Take 40 mg by mouth daily.   Yes [provider]  traMADol DANNY) 50 MG tablet  08/05/22  Yes [provider]    Family History History reviewed. No pertinent family history.  Social History Social History   Tobacco Use   Smoking status: Every Day    Current packs/day: 0.50    Types: Cigarettes   Smokeless tobacco: Never  Vaping Use   Vaping status: Never Used  Substance Use Topics   Alcohol use: Yes    Comment: social   Drug use: No     Allergies   Patient has no known allergies.   Review of Systems Review of Systems  Constitutional:  Positive for appetite change and diaphoresis.  HENT:  Positive for ear pain and sore throat. Negative for congestion, sinus pressure and sinus pain.   Eyes:  Negative for visual disturbance.  Respiratory:  Positive for cough (intermittent, mild and dry) and chest tightness.   Cardiovascular:  Negative for chest pain.  Gastrointestinal:  Negative for diarrhea, nausea and vomiting.  Genitourinary:  Positive  for dysuria (mild burning). Negative for flank pain and hematuria.  Skin:  Negative for rash.  Neurological:  Negative for dizziness, light-headedness and headaches.     Physical Exam Triage Vital Signs ED Triage Vitals  Encounter Vitals Group     BP 02/10/24 1310 (!) 208/112     Girls Systolic BP Percentile --      Girls Diastolic BP Percentile --      Boys Systolic BP Percentile --      Boys Diastolic BP Percentile --      Pulse Rate 02/10/24 1310 99     Resp 02/10/24 1310 19     Temp 02/10/24 1310 98.4 F (36.9 C)     Temp Source 02/10/24 1310 Oral     SpO2 02/10/24 1310 96 %     Weight 02/10/24 1313 142 lb (64.4  kg)     Height 02/10/24 1313 5' 5 (1.651 m)     Head Circumference --      Peak Flow --      Pain Score 02/10/24 1312 5     Pain Loc --      Pain Education --      Exclude from Growth Chart --    No data found.  Updated Vital Signs BP (!) 180/102 (BP Location: Right Arm)   Pulse 85   Temp 98.4 F (36.9 C) (Oral)   Resp 18   Ht 5' 5 (1.651 m)   Wt 142 lb (64.4 kg)   SpO2 96%   BMI 23.63 kg/m   Visual Acuity Right Eye Distance:   Left Eye Distance:   Bilateral Distance:    Right Eye Near:   Left Eye Near:    Bilateral Near:     Physical Exam Vitals reviewed.  Constitutional:      General: She is awake.     Appearance: Normal appearance. She is well-developed and well-groomed.  HENT:     Head: Normocephalic and atraumatic.     Right Ear: Hearing, tympanic membrane and ear canal normal.     Left Ear: Hearing, tympanic membrane and ear canal normal.     Mouth/Throat:     Lips: Pink.     Mouth: Mucous membranes are moist.     Pharynx: Oropharynx is clear. Uvula midline. Posterior oropharyngeal erythema present. No pharyngeal swelling, oropharyngeal exudate, uvula swelling or postnasal drip.  Cardiovascular:     Rate and Rhythm: Normal rate and regular rhythm.     Pulses: Normal pulses.          Radial pulses are 2+ on the right side and 2+ on the left side.     Heart sounds: Normal heart sounds. No murmur heard.    No friction rub. No gallop.  Pulmonary:     Effort: Pulmonary effort is normal.     Breath sounds: Normal breath sounds. No decreased air movement. No decreased breath sounds, wheezing, rhonchi or rales.  Musculoskeletal:     Cervical back: Normal range of motion and neck supple.  Lymphadenopathy:     Head:     Right side of head: No submental, submandibular or preauricular adenopathy.     Left side of head: No submental, submandibular or preauricular adenopathy.     Cervical:     Right cervical: No superficial cervical adenopathy.    Left  cervical: No superficial cervical adenopathy.     Upper Body:     Right upper body: No supraclavicular adenopathy.     Left  upper body: No supraclavicular adenopathy.  Skin:    General: Skin is warm and dry.  Neurological:     General: No focal deficit present.     Mental Status: She is alert and oriented to person, place, and time.  Psychiatric:        Mood and Affect: Mood normal.        Behavior: Behavior normal. Behavior is cooperative.        Thought Content: Thought content normal.        Judgment: Judgment normal.      UC Treatments / Results  Labs (all labs ordered are listed, but only abnormal results are displayed) Labs Reviewed  POCT URINE DIPSTICK - Abnormal; Notable for the following components:      Result Value   Clarity, UA turbid (*)    Blood, UA moderate (*)    Nitrite, UA Positive (*)    Leukocytes, UA Large (3+) (*)    All other components within normal limits  URINE CULTURE  CULTURE, GROUP A STREP ([facility])  POC COVID19/FLU A&B COMBO  POCT RAPID STREP A (OFFICE)    EKG   Radiology No results found.  Procedures Procedures (including critical care time)  Medications Ordered in UC Medications - No data to display  Initial Impression / Assessment and Plan / UC Course  I have reviewed the triage vital signs and the nursing notes.  Pertinent labs & imaging results that were available during my care of the patient were reviewed by me and considered in my medical decision making (see chart for details).      Final Clinical Impressions(s) / UC Diagnoses   Final diagnoses:  Dysuria  Sore throat  Primary hypertension   Patient presents today for concerns of pain with urination as well as upper respiratory infection symptoms.  Patient reports pain with urination as well as increased frequency but she also states that she has a history of bladder inflammation so is unsure if this is due to this or current UTI.  She reports that she recently had foot  surgery but is not concerned for surgical site infection and denies redness, swelling, purulent drainage.  She declines offer to examine incision area.  Physical exam is notable for posterior oropharyngeal erythema.  Strep, flu, COVID testing were negative.  Will send strep culture off for definitive rule out given posterior oropharyngeal erythema.  Urine dip is notable for blood, nitrites, leukocytes consistent with likely UTI.  Will start Keflex  5 mg p.o. twice daily x 5 days.  Urine culture to dictate further management.  Of note patient's blood pressure is severely elevated during triage.  This did come down to 180/102 but this is still rather high.  She denies vision changes, headache, chest pain, shortness of breath at this time and does have a previous history of hypertension which seems to be managed with telmisartan 40 mg p.o. daily.  Unsure if blood pressure is exacerbated due to current symptoms versus worsening of chronic hypertension.  Will start patient on hydralazine  10 mg p.o. 3 times daily as needed with instructions to check blood pressure at least twice per day.  Reviewed signs and symptoms of hypertensive emergency which were also provided in AVS.  For upper respiratory tract symptoms recommend over-the-counter medications as needed for symptomatic relief.  Recommend close follow-up with surgeon to ensure appropriate recovery.  Follow-up as needed     Discharge Instructions      Based on your symptoms and results of the  urinalysis I believe you have a UTI I recommend the following:  I have sent in a script for keflex  500 mg to be taken by mouth twice per day for 5 days  Please finish the entire course of the antibiotic even if you are feeling better before it is completed unless you develop an allergic reaction or are told by a medical provider to stop taking it. We have sent a sample of your urine off for a urine culture.  This will help us  determine what bacteria is causing your  symptoms as well as the most appropriate antibiotic to treat it.  If we need to make any adjustments to your medication regimen and new medication will be sent to the pharmacy on file and you will be updated via phone call in MyChart. Stay well hydrated (at least 75 oz of water per day) and avoid holding your urine for prolonged periods of time. If you have any of the following please return to urgent care or go to the emergency room: Persistent symptoms, fever, trouble urinating or inability to urinate, confusion, flank pain.   Based on your described symptoms and the duration of symptoms it is likely that you have a viral upper respiratory infection (often called a cold)  Symptoms can last for 3-10 days with lingering cough and intermittent symptoms lasting weeks after that.  The goal of treatment at this time is to reduce your symptoms and discomfort   I recommend using Robitussin and Mucinex (regular formulations, nothing with decongestants or DM)  You can also use Tylenol  for body aches and fever reduction I also recommend adding an antihistamine to your daily regimen This includes medications like Claritin, Allegra, Zyrtec- the generics of these work very well and are usually less expensive I recommend using Flonase nasal spray - 2 puffs twice per day to help with your nasal congestion The antihistamines and Flonase can take a few weeks to provide significant relief from allergy symptoms but should start to provide some benefit soon. You can use a humidifier at night to help with preventing nasal dryness and irritation   If your symptoms are not improving or seem to be getting worse over the next 5 to 7 days you can always return here to urgent care or you can follow-up with your primary care provider for ongoing management Go to the ER if you begin to have more serious symptoms such as shortness of breath, trouble breathing, loss of consciousness, swelling around the eyes, high fever,  severe lasting headaches, vision changes or neck pain/stiffness.   Your blood pressure was rather high today.  I am sending in a medication called hydralazine  for you to take as needed up to 3 times per day.  Please check your blood pressure at home and if you notice that it is over 150 systolic and over 95 diastolic please take hydralazine . If your blood pressure gets over 180 systolic and over 105 diastolic and you develop any of the following symptoms please go to the emergency room: Headache, vision changes, chest pain, shortness of breath or trouble breathing, difficulty urinating or blood in the urine     ED Prescriptions     Medication Sig Dispense Auth. Provider   cephALEXin  (KEFLEX ) 500 MG capsule Take 1 capsule (500 mg total) by mouth 2 (two) times daily for 5 days. 10 capsule Dinh Ayotte E, PA-C   hydrALAZINE  (APRESOLINE ) 10 MG tablet Take 1 tablet (10 mg total) by mouth 3 (three) times daily.  15 tablet Silas Sedam E, PA-C      PDMP not reviewed this encounter.   Marylene Rocky BRAVO, PA-C 02/10/24 1553

## 2024-02-13 LAB — URINE CULTURE: Culture: >=100000 — AB

## 2024-02-14 LAB — CULTURE, GROUP A STREP (THRC)

## 2024-02-16 ENCOUNTER — Ambulatory Visit (HOSPITAL_COMMUNITY): Payer: Self-pay
